# Patient Record
Sex: Female | Born: 1950 | Race: White | Hispanic: No | Marital: Married | Smoking: Former smoker
Health system: Southern US, Community
[De-identification: ages and names within clinical notes are randomized; demographics above are authoritative.]

## PROBLEM LIST (undated history)

## (undated) DIAGNOSIS — F329 Major depressive disorder, single episode, unspecified: Secondary | ICD-10-CM

## (undated) DIAGNOSIS — F419 Anxiety disorder, unspecified: Secondary | ICD-10-CM

## (undated) DIAGNOSIS — R112 Nausea with vomiting, unspecified: Secondary | ICD-10-CM

## (undated) DIAGNOSIS — M1990 Unspecified osteoarthritis, unspecified site: Secondary | ICD-10-CM

## (undated) DIAGNOSIS — R457 State of emotional shock and stress, unspecified: Secondary | ICD-10-CM

## (undated) DIAGNOSIS — G4730 Sleep apnea, unspecified: Secondary | ICD-10-CM

## (undated) DIAGNOSIS — E785 Hyperlipidemia, unspecified: Secondary | ICD-10-CM

## (undated) DIAGNOSIS — Z9889 Other specified postprocedural states: Secondary | ICD-10-CM

## (undated) DIAGNOSIS — E7800 Pure hypercholesterolemia, unspecified: Secondary | ICD-10-CM

## (undated) DIAGNOSIS — IMO0001 Reserved for inherently not codable concepts without codable children: Secondary | ICD-10-CM

## (undated) DIAGNOSIS — Z90710 Acquired absence of both cervix and uterus: Secondary | ICD-10-CM

## (undated) DIAGNOSIS — F32A Depression, unspecified: Secondary | ICD-10-CM

## (undated) DIAGNOSIS — Z9049 Acquired absence of other specified parts of digestive tract: Secondary | ICD-10-CM

## (undated) DIAGNOSIS — R7303 Prediabetes: Secondary | ICD-10-CM

## (undated) DIAGNOSIS — I10 Essential (primary) hypertension: Secondary | ICD-10-CM

## (undated) DIAGNOSIS — N289 Disorder of kidney and ureter, unspecified: Secondary | ICD-10-CM

## (undated) HISTORY — PX: CHOLECYSTECTOMY: SHX55

## (undated) HISTORY — PX: ABDOMINAL HYSTERECTOMY: SHX81

---

## 2001-02-03 ENCOUNTER — Other Ambulatory Visit: Admission: RE | Admit: 2001-02-03 | Discharge: 2001-02-03 | Payer: Self-pay | Admitting: Obstetrics and Gynecology

## 2001-02-09 ENCOUNTER — Ambulatory Visit (HOSPITAL_COMMUNITY): Admission: RE | Admit: 2001-02-09 | Discharge: 2001-02-09 | Payer: Self-pay | Admitting: Obstetrics and Gynecology

## 2001-02-09 ENCOUNTER — Encounter: Payer: Self-pay | Admitting: Obstetrics and Gynecology

## 2001-03-01 ENCOUNTER — Encounter (INDEPENDENT_AMBULATORY_CARE_PROVIDER_SITE_OTHER): Payer: Self-pay | Admitting: Specialist

## 2001-03-01 ENCOUNTER — Ambulatory Visit: Admission: RE | Admit: 2001-03-01 | Discharge: 2001-03-01 | Payer: Self-pay | Admitting: Gynecology

## 2001-03-04 ENCOUNTER — Encounter: Payer: Self-pay | Admitting: Gynecology

## 2001-03-09 ENCOUNTER — Inpatient Hospital Stay (HOSPITAL_COMMUNITY): Admission: RE | Admit: 2001-03-09 | Discharge: 2001-03-12 | Payer: Self-pay | Admitting: Obstetrics and Gynecology

## 2001-03-17 ENCOUNTER — Ambulatory Visit (HOSPITAL_COMMUNITY): Admission: RE | Admit: 2001-03-17 | Discharge: 2001-03-17 | Payer: Self-pay | Admitting: Obstetrics and Gynecology

## 2001-03-17 ENCOUNTER — Encounter: Payer: Self-pay | Admitting: Obstetrics and Gynecology

## 2001-04-26 ENCOUNTER — Ambulatory Visit (HOSPITAL_COMMUNITY): Admission: RE | Admit: 2001-04-26 | Discharge: 2001-04-26 | Payer: Self-pay | Admitting: Family Medicine

## 2001-04-26 ENCOUNTER — Encounter: Payer: Self-pay | Admitting: Family Medicine

## 2002-02-21 ENCOUNTER — Emergency Department (HOSPITAL_COMMUNITY): Admission: EM | Admit: 2002-02-21 | Discharge: 2002-02-22 | Payer: Self-pay | Admitting: Emergency Medicine

## 2002-02-22 ENCOUNTER — Encounter: Payer: Self-pay | Admitting: Emergency Medicine

## 2002-05-05 ENCOUNTER — Other Ambulatory Visit: Admission: RE | Admit: 2002-05-05 | Discharge: 2002-05-05 | Payer: Self-pay | Admitting: Obstetrics and Gynecology

## 2002-07-19 ENCOUNTER — Emergency Department (HOSPITAL_COMMUNITY): Admission: EM | Admit: 2002-07-19 | Discharge: 2002-07-19 | Payer: Self-pay | Admitting: Emergency Medicine

## 2002-08-26 ENCOUNTER — Emergency Department (HOSPITAL_COMMUNITY): Admission: EM | Admit: 2002-08-26 | Discharge: 2002-08-26 | Payer: Self-pay | Admitting: Emergency Medicine

## 2003-07-18 ENCOUNTER — Ambulatory Visit (HOSPITAL_COMMUNITY): Admission: RE | Admit: 2003-07-18 | Discharge: 2003-07-18 | Payer: Self-pay | Admitting: Family Medicine

## 2003-07-26 ENCOUNTER — Ambulatory Visit (HOSPITAL_COMMUNITY): Admission: RE | Admit: 2003-07-26 | Discharge: 2003-07-26 | Payer: Self-pay | Admitting: General Surgery

## 2004-06-30 ENCOUNTER — Other Ambulatory Visit: Admission: RE | Admit: 2004-06-30 | Discharge: 2004-06-30 | Payer: Self-pay | Admitting: Obstetrics and Gynecology

## 2006-08-13 ENCOUNTER — Ambulatory Visit (HOSPITAL_COMMUNITY): Admission: RE | Admit: 2006-08-13 | Discharge: 2006-08-13 | Payer: Self-pay | Admitting: Orthopaedic Surgery

## 2006-11-17 ENCOUNTER — Ambulatory Visit (HOSPITAL_COMMUNITY): Admission: RE | Admit: 2006-11-17 | Discharge: 2006-11-17 | Payer: Self-pay | Admitting: General Surgery

## 2006-11-17 ENCOUNTER — Encounter (INDEPENDENT_AMBULATORY_CARE_PROVIDER_SITE_OTHER): Payer: Self-pay | Admitting: General Surgery

## 2008-03-14 ENCOUNTER — Ambulatory Visit (HOSPITAL_COMMUNITY): Admission: RE | Admit: 2008-03-14 | Discharge: 2008-03-14 | Payer: Self-pay | Admitting: Family Medicine

## 2008-03-19 ENCOUNTER — Ambulatory Visit (HOSPITAL_COMMUNITY): Admission: RE | Admit: 2008-03-19 | Discharge: 2008-03-19 | Payer: Self-pay | Admitting: Family Medicine

## 2008-04-18 ENCOUNTER — Other Ambulatory Visit: Admission: RE | Admit: 2008-04-18 | Discharge: 2008-04-18 | Payer: Self-pay | Admitting: Interventional Radiology

## 2008-04-18 ENCOUNTER — Encounter (INDEPENDENT_AMBULATORY_CARE_PROVIDER_SITE_OTHER): Payer: Self-pay | Admitting: Interventional Radiology

## 2008-04-18 ENCOUNTER — Encounter: Admission: RE | Admit: 2008-04-18 | Discharge: 2008-04-18 | Payer: Self-pay | Admitting: Endocrinology

## 2008-10-18 ENCOUNTER — Ambulatory Visit (HOSPITAL_COMMUNITY): Admission: RE | Admit: 2008-10-18 | Discharge: 2008-10-18 | Payer: Self-pay | Admitting: Family Medicine

## 2008-12-07 ENCOUNTER — Emergency Department (HOSPITAL_COMMUNITY): Admission: EM | Admit: 2008-12-07 | Discharge: 2008-12-07 | Payer: Self-pay | Admitting: Emergency Medicine

## 2008-12-12 ENCOUNTER — Ambulatory Visit (HOSPITAL_COMMUNITY): Admission: RE | Admit: 2008-12-12 | Discharge: 2008-12-12 | Payer: Self-pay | Admitting: Family Medicine

## 2009-06-25 ENCOUNTER — Emergency Department (HOSPITAL_COMMUNITY): Admission: EM | Admit: 2009-06-25 | Discharge: 2009-06-26 | Payer: Self-pay | Admitting: Emergency Medicine

## 2010-01-02 ENCOUNTER — Ambulatory Visit (HOSPITAL_COMMUNITY): Admission: RE | Admit: 2010-01-02 | Discharge: 2010-01-02 | Payer: Self-pay | Admitting: Internal Medicine

## 2010-03-06 ENCOUNTER — Encounter: Admission: RE | Admit: 2010-03-06 | Discharge: 2010-03-06 | Payer: Self-pay | Admitting: Endocrinology

## 2010-07-23 LAB — URINALYSIS, ROUTINE W REFLEX MICROSCOPIC
Bilirubin Urine: NEGATIVE
Glucose, UA: NEGATIVE mg/dL
Ketones, ur: NEGATIVE mg/dL
Leukocytes, UA: NEGATIVE
Specific Gravity, Urine: >1.03 — ABNORMAL HIGH (ref 1.005–1.030)
pH: 5.5 (ref 5.0–8.0)

## 2010-07-23 LAB — BASIC METABOLIC PANEL
BUN: 23 mg/dL (ref 6–23)
Calcium: 9.8 mg/dL (ref 8.4–10.5)
Chloride: 102 mEq/L (ref 96–112)
Creatinine, Ser: 0.96 mg/dL (ref 0.4–1.2)
GFR calc non Af Amer: 60 mL/min — ABNORMAL LOW (ref >60)
Glucose, Bld: 128 mg/dL — ABNORMAL HIGH (ref 70–99)
Potassium: 4.2 mEq/L (ref 3.5–5.1)

## 2010-07-23 LAB — CBC
MCV: 86.7 fL (ref 78.0–100.0)
Platelets: 242 10*3/uL (ref 150–400)
RDW: 14 % (ref 11.5–15.5)

## 2010-07-23 LAB — DIFFERENTIAL
Basophils Absolute: 0 10*3/uL (ref 0.0–0.1)
Lymphocytes Relative: 6 % — ABNORMAL LOW (ref 12–46)
Monocytes Absolute: 0.5 10*3/uL (ref 0.1–1.0)
Neutro Abs: 13.7 10*3/uL — ABNORMAL HIGH (ref 1.7–7.7)

## 2010-07-23 LAB — URINE MICROSCOPIC-ADD ON

## 2010-09-16 NOTE — H&P (Signed)
NAMEMARLINDA, [name]                ACCOUNT NO.:  [id number]   MEDICAL RECORD NO.:  [id number]          PATIENT TYPE:  AMB   LOCATION:  DAY                           FACILITY:  [facility]   PHYSICIAN:  Dalia Heading, M.D.  DATE OF BIRTH:  March 21, 1951   DATE OF ADMISSION:  11/17/2006  DATE OF DISCHARGE:  LH                              HISTORY & PHYSICAL   CHIEF COMPLAINT:  Hematochezia.   HISTORY OF PRESENT ILLNESS:  The patient is a 60 year old white female  who was referred for endoscopic evaluation.  She needs a colonoscopy for  intermittent hematochezia.  No abdominal pain, weight loss, nausea,  vomiting, diarrhea, constipation, or melena have been noted.  She has  never had a colonoscopy.  There is no family history of colon carcinoma.  There is a questionable history of hemorrhoidal disease with difficulty  keeping herself clean after wiping.   PAST MEDICAL HISTORY:  1. Hypertension.  2. Depression.   PAST SURGICAL HISTORY:  1. Cholecystectomy.  2. Hysterectomy.  3. Wisdom teeth pulled.   CURRENT MEDICATIONS:  Diovan, Lexapro, labetalol, Lipitor, calcium  supplements, fish oil supplements.   ALLERGIES:  Norvasc.   REVIEW OF SYSTEMS:  Noncontributory.   PHYSICAL EXAMINATION:  GENERAL:  The patient is a well-developed, well-  nourished white female in no acute distress.  LUNGS:  Clear to auscultation with equal breath sounds bilaterally.  HEART:  Examination reveals a regular rate and rhythm without S3, S4, or  murmurs.  ABDOMEN:  Soft, nontender and non-distended.  No hepatosplenomegaly or  masses are noted.  RECTAL:  Deferred to the procedure.   IMPRESSION:  Hematochezia.   PLAN:  The patient is scheduled for a colonoscopy on November 17, 2006.  The  risks and benefits of the procedure including bleeding and perforation  were fully explained to the patient, who gave informed consent.      Dalia Heading, M.D.  Electronically Signed     MAJ/MEDQ  D:  11/11/2006   T:  11/12/2006  Job:  [id number]   cc:   Patrica Duel, M.D.  Fax: [phone]

## 2010-09-19 NOTE — Op Note (Signed)
[name], [name]                       ACCOUNT NO.:  [id number]   MEDICAL RECORD NO.:  [id number]                   PATIENT TYPE:  AMB   LOCATION:  DAY                                  FACILITY:  [facility]   PHYSICIAN:  Dalia Heading, M.D.               DATE OF BIRTH:  [birth year]   DATE OF PROCEDURE:  DATE OF DISCHARGE:                                 OPERATIVE REPORT   PREOPERATIVE DIAGNOSIS:  Cholecystitis, cholelithiasis.   POSTOPERATIVE DIAGNOSIS:  Cholecystitis, cholelithiasis.   PROCEDURE:  Laparoscopic cholecystectomy   SURGEON:  Dalia Heading, M.D.   ASSISTANT:  Buena Irish, M.D.   ANESTHESIA:  General endotracheal.   INDICATIONS:  The patient is a 60 year old white female who presents with  cholecystitis secondary to cholelithiasis.  The risks and benefits of the  procedure including bleeding, infection, hepatobiliary injury, and the  possibility of an open procedure were fully explained to the patient, who  gave informed consent.   DESCRIPTION OF PROCEDURE:  The patient was placed in the supine position.  After induction of general endotracheal anesthesia, the abdomen was prepped  and draped using the usual sterile technique with Betadine.  Surgical site  confirmation was performed.   A supraumbilical incision was made down to the fascia.  A Veress needle was  introduced into the abdominal cavity and confirmation of placement was done  using the saline drop test.  The abdomen was then insufflated to 16 mmHg  pressure.  An 11-mm trocar was introduced into the abdominal cavity under  direct visualization without difficulty.  The patient was placed then in  reverse Trendelenburg position and an additional 11-mm trocar was placed in  the epigastric region and 5-mm trocars were placed in the right upper  quadrant and right flank regions.   The liver was inspected and noted to be within normal limits.  The  gallbladder was retracted superiorly and  laterally.  The dissection was  begun around the infundibulum of the gallbladder.  The cystic duct was first  identified.  Its juncture to the infundibulum fully identified.  Endoclips  were placed proximally and distally on the cystic duct; and the cystic duct  was divided.  This was likewise done on the cystic artery.  The gallbladder  was then freed away from the gallbladder fossa using Bovie electrocautery.  The gallbladder was delivered through the epigastric trocar site using an  EndoCatch bag.  The gallbladder fossa was inspected and no abnormal bleeding  or bile leakage was noted.  Surgicel was placed in the gallbladder fossa.  All fluid and air were then evacuated from the abdominal cavity prior to  removal of the trocars.   All wounds were irrigated with normal saline.  All wounds were injected with  0.5% Sensorcaine.  The supraumbilical fascia was reapproximated using an #0  Vicryl interrupted suture. All skin incisions were closed using staples.  Betadine ointment and  dry sterile dressings were applied.   All tape and needle counts correct at the end of the procedure.  The patient  was extubated in the operating room and went back to recovery room in awake  and stable condition.   COMPLICATIONS:  None.   SPECIMENS:  Gallbladder with stones.   BLOOD LOSS:  Minimal.      ___________________________________________                                            Dalia Heading, M.D.   MAJ/MEDQ  D:  07/26/2003  T:  07/26/2003  Job:  [id number]   cc:   Dalia Heading, M.D.  [address] [place]  [place] [address]  Fax: [phone]   Patrica Duel, M.D.  [address]  [place]  [place] [address]  Fax: [phone]

## 2010-09-19 NOTE — Consult Note (Signed)
[facility]  Patient:    [name], [name] Visit Number: [id number] MRN: [id number]          Service Type: GON Location: GYN Attending Physician:  Jeannette Corpus Dictated by:   Rande Brunt. Clarke-Pearson, M.D. Proc. Date: 03/01/01 Admit Date:  03/01/2001   CC:         Guy Sandifer. Arleta Creek, M.D.  Telford Nab, R.N.   Consultation Report  The patient is a 60 year old white female referred by Dr. Harold Hedge for preoperative evaluation.  The patient in August 2002, saw her primary gynecologist in [place] who discovered an ovarian cyst.  She subsequently sought second opinion from Dr. Henderson Cloud who re-evaluated the patient and found stable ovarian cyst.  On ultrasound the cyst is 6.5 x 6.1 x 5.1 cm, and there are several nodular densities on the edge of the cyst, there is no increase in color flow.  The pelvic MRI has been done in September, which showed bilateral adnexal cysts with a mass measuring 6.2 cm.  The patient has a normal CEA, and a CA125 of 14 units per mL.  The patient herself denies any abdominal pain or pressure.  Has no real GI or GU symptoms.  PAST MEDICAL HISTORY: 1. Hypertension. 2. Obesity. 3. Anxiety.  CURRENT MEDICATIONS: 1. Prinivil 10 mg q.d. 2. Maxzide 25 mg q.d. 3. Effexor 50 mg q.h.s.  PAST SURGICAL HISTORY: 1. Cesarean section x 2. 2. Dilatation and curettage. 3. Abdominal hysterectomy in 1984, because of heavy bleeding.  SOCIAL HISTORY:  The patient does not smoke or drink.  OBSTETRICAL HISTORY:  Gravida 3, para 2.  ALLERGIES:  No known drug allergies.  PHYSICAL EXAMINATION:  VITAL SIGNS:  Weight 240 pounds, height 5 feet 7 inches, blood pressure 155/94, pulse 92, respiratory rate 18.  GENERAL:  The patient is a moderately obese white female in no acute distress.  HEENT:  Negative.  NECK:  Supple without thyromegaly.  There is no supraclavicular or inguinal adenopathy.  ABDOMEN:  Obese, soft,  nontender, no mass, organomegaly, ascites, or hernias are noted.  PELVIC:  EGBUS normal.  Vagina is clean, well supported, no lesions noted. Bimanual and rectovaginal examination reveal some fullness at the vaginal cuff without a discrete mass.  Rectovaginal examination confirms.  IMPRESSION:  Ovarian cyst with some nodularity, normal CA125.  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  I will be happy to serve as a co-surgeon with Dr. Henderson Cloud at the time of surgery if he desires.  Risks of surgery were outlined to the patient.  She is aware of the risks of infection, hemorrhage, injury to adjacent viscera, and thromboembolic complications.  Further, on the outside chance that this is an ovarian cancer, I explained to her the extent of surgical staging and potential future therapy.  We will coordinate with Dr. Marvetta Gibbons office. Dictated by:   Rande Brunt. Clarke-Pearson, M.D. Attending Physician:  Jeannette Corpus DD:  03/02/01 TD:  03/02/01 Job: [id number]

## 2010-09-19 NOTE — H&P (Signed)
[name], [name]                            ACCOUNT NO.:  [id number]   MEDICAL RECORD NO.:  [id number]                  PATIENT TYPE:   LOCATION:                                       FACILITY:   PHYSICIAN:  Dalia Heading, M.D.               DATE OF BIRTH:  [birth year]   DATE OF ADMISSION:  DATE OF DISCHARGE:                                HISTORY & PHYSICAL   CHIEF COMPLAINT:  1. Biliary colic.  2. Cholelithiasis.   HISTORY OF PRESENT ILLNESS:  The patient is a 60 year old white female who  was referred for evaluation and treatment of biliary colic secondary to  cholelithiasis.  She has been having intermittent episodes of right upper  quadrant abdominal pain with radiation to the right flank, nausea and  bloating for over a year.  Her symptoms seemed to be worsening.  No fever,  chills or jaundice have been noted.   PAST MEDICAL HISTORY:  Includes depression, hypertension.   PAST SURGICAL HISTORY:  1. Hysterectomy.  2. C-section x2.   CURRENT MEDICATIONS:  1. Diovan 80 mg p.o. q.a.m.  2. Lexapro 20 mg p.o. q.a.m.  3. Labetalol 200 mg p.o. q.h.s.  4. Sodium bicarbonate 650 mg p.o. q.h.s.  5. Vytorin 10/20 one tablet q.h.s.  6. Vitamin supplements.  7. Ambien and Xanax p.r.n.   ALLERGIES:  An ACE inhibitor, Norvasc, nonsteroidal antiinflammatory drugs.   REVIEW OF SYSTEMS:  The patient has had a history of renal insufficiency,  although this has resolved.  This occurred after having a hysterectomy in  2002.  She denies any recent cardiopulmonary difficulties or bleeding  disorders.  She denies drinking or smoking.   PHYSICAL EXAMINATION:  GENERAL:  The patient is a well-developed, well-  nourished white female, in no acute distress.  VITAL SIGNS:  She is afebrile.  Vital signs are stable.  HEENT:  Examination reveals no scleral icterus.  LUNGS:  Clear to auscultation with equal breath sounds bilaterally.  HEART:  Examination reveals a regular rate and rhythm  without S3, S4 or  murmurs.  ABDOMEN:  Soft with slight tenderness noted in the right upper quadrant to  palpation.  No hepatosplenomegaly, masses or hernias are identified.  Ultrasound of the gallbladder in the past revealed cholelithiasis.  Hepatobiliary scan revealed no filling of the gallbladder.   IMPRESSION:  1. Cholecystitis.  2. Cholelithiasis.   PLAN:  The patient is scheduled for laparoscopic cholecystectomy on July 26, 2003.  The risks and benefits of the procedure including bleeding,  infection, hepatobiliary injury and the possibility of an open procedure  were fully explained to the patient, who gave informed consent.     ___________________________________________  Dalia Heading, M.D.   MAJ/MEDQ  D:  07/24/2003  T:  07/24/2003  Job:  [id number]   cc:   Patrica Duel, M.D.  [address]  [place]  [place] [address]  Fax: [phone]

## 2010-09-19 NOTE — Discharge Summary (Signed)
[facility]  Patient:    [name], [name] Visit Number: [id number] MRN: [id number]          Service Type: GYN Location: 4W 0455 01 Attending Physician:  Soledad Gerlach Dictated by:   Guy Sandifer Arleta Creek, M.D. Admit Date:  03/09/2001 Discharge Date: 03/12/2001                             Discharge Summary  ADMITTING DIAGNOSIS:  Bilateral ovarian cysts.  DISCHARGE DIAGNOSIS:  Bilateral ovarian cysts.  PROCEDURE:  Exploratory laparotomy with bilateral salpingo-oophorectomy and lysis of adhesions on 03/09/01.  REASON FOR ADMISSION:  This patient is a 60 year old married white female, G3, P2, AB1, status post hysterectomy, noted to have bilateral ovarian cysts. Details are dictated in the history and physical.  HOSPITAL COURSE:  On March 09, 2001, patient is taken to the operating room and undergoes the above procedure.  Estimated blood loss is less than 100 cc. On the evening of surgery, she has good pain control.  Blood pressures are in the 80s/50s when the patient is sedated with her PCA morphine.  It ranged from the 80s to the 110s/50s to 70s.  Pulse was 80 and regular and she was afebrile.  Urine output was somewhat concentrated and the remainder of her exam was within normal limits.  She was treated with an IV fluid bolus at that time.  She received a second fluid bolus at approximately 3 a.m. for concentrated urine and decreased urine output.  On the morning of November 7, she continued to have good pain relief and was without flatus.  Blood pressures were 110s/60s and she was afebrile.  Urine output at that point was dilute.  White count was 11.0, hemoglobin 11.3.  Her Prinivil was held that morning.  IV fluids were increased for a bolus of 500 cc.  On the evening of November 7, patient was somewhat anxious as she had been preoperatively. She had been taking Effexor as well as Xanax preoperatively.  She also complained of gas  pain and was not yet passing flatus.  She was having nausea and vomiting only with some liquids, although was doing somewhat better that evening.  Blood pressures were 110s to 120s/70s.  She had approximately 100 cc of dilute urine in the Foley bag at that time.  Repeat blood work that day revealed a stable CBC.  Her potassium was noted to be 2.9.  IV potassium had been ordered for the patient although she complained of pain with this. Therefore, they were held at that time.  She was given a Dulcolax suppository, IV Ativan, and IV fluids.  Ambulation was encouraged.  On the morning of November 8, she was hungry, having a bowel movement.  She remained afebrile and blood pressures were 100s to 140s/50s to 70s.  Urine output was improving and was dilute at that time.  Her diet was advanced.  She was given oral potassium.  On the morning of November 9, she is feeling much better, tolerating a regular diet, and remains afebrile.  Blood pressures are 150s/70s.   Repeat blood work reveals potassium to be 3.6 and creatinine elevated to 3.5.  It should be noted that her Prinivil had been elevated from 10 to 20 mg several days prior to surgery.  Patient was discharged home in good condition.   Diet:  Regular as tolerated.  Activity:  No lifting or operation of automobiles.  No vaginal entry.  She is to call the office for problems including but not limited to increasing pain, persistent nausea and vomiting, temperature of 100 degrees.  Follow-up is in the office in two days.  MEDICATIONS: 1. Percocet 5/325 #30 one to two p.o. q.6h. p.r.n. 2. Phenergan 25 mg tablets #10 one p.o. q.8h. p.r.n. with no refills.  She will resume hydrochlorothiazide, Effexor, and Xanax p.r.n.  Will hold the Effexor until reevaluation of creatinine status and blood pressure in two days. Dictated by:   Guy Sandifer Arleta Creek, M.D. Attending Physician:  Soledad Gerlach DD:  03/12/01 TD:  03/13/01 Job:  [id number]

## 2010-09-19 NOTE — Op Note (Signed)
[facility]  Patient:    [name], [name] Visit Number: [id number] MRN: [id number]          Service Type: GYN Location: 4W 0455 01 Attending Physician:  Soledad Gerlach Dictated by:   Rande Brunt Clarke-Pearson, M.D. Proc. Date: 03/09/01 Admit Date:  03/09/2001                             Operative Report  PREOPERATIVE DIAGNOSIS:  Bilateral complex pelvic masses.  POSTOPERATIVE DIAGNOSIS:  Bilateral serous cystadenofibroma of the ovaries.  OPERATION:  Exploratory laparotomy, bilateral salpingo-oophorectomy, lysis of intestinal adhesions.  COSURGEONS: 1. Daniel L. Clarke-Pearson, M.D. 2. Guy Sandifer. Arleta Creek, M.D.  ASSISTANT:  Telford Nab, R.N.  ANESTHESIA:  General with orotracheal tubes.  ESTIMATED BLOOD LOSS:  Less than 100 cc.  SURGICAL FINDINGS:  At exploratory laparotomy the sigmoid colon was densely adherent to a 6-7 cm complex left ovarian mass.  Adhesions were also present on the right side of the pelvis which contained a slightly smaller 5 cm cystic mass of the right ovary.  On frozen section, both ovaries were found to be serous cystadenofibromas.  Exploration of the upper abdomen including diaphragms, liver, omentum, and stomach were normal without any pathology recognized.  DESCRIPTION OF PROCEDURE:  The patient was brought to the operating room and after the satisfactory attainment of general anesthesia was placed in the modified lithotomy position in the [place] stirrups.  The anterior abdominal wall, perineum and vagina were prepped with Betadine.  Foley catheter was placed, and the patient was draped.  The abdomen was entered through a low midline incision and peritoneal washings were obtained.  The upper abdomen and pelvis were explored with the above noted findings.  Omental adhesions to the pelvis were freed with sharp and blunt dissection.  Buchwalter retractor was positioned, and the bowel was packed out of  the pelvis.  Attention was turned to the larger mass on the left side of the pelvis.  The retroperitoneal space on the left side of the pelvis was opened, identifying the psoas muscle, external iliac artery, and ureter.  Adhesions of the ovarian tumor to the sigmoid colon mesentery were lysed with sharp and blunt dissection using Bovie cautery.  The ovarian vessels were skeletonized, clamped, cut, free tied and suture ligated using 2-0 Vicryl.  I further mobilized the ovarian tumor away from the mesentery and the pelvic peritoneum until it was completely freed and submitted for frozen section.  Hemostasis was achieved with cautery.  Attention was turned to the right side of the pelvis where a similar procedure was performed entering the retroperitoneal space, identifying the ureter and ligating the ovarian vessels.  Using sharp and blunt dissection, the right tube and ovary were removed and submitted to frozen section.  The pelvis was irrigated with copious amounts of warm saline and hemostasis assured.  The retractors and packs were removed.  The anterior abdominal wall was closed in layers, the first being a running mass closure using #1 PDS.  The subcutaneous tissue was irrigated.  Hemostasis was achieved with cautery and the subcutaneous tissue reapproximated with interrupted 3-0 Vicryl sutures.  The skin was closed with skin staples.  A dressing was applied. The patient was awakened from anesthesia and taken to the recovery room in satisfactory condition.  Sponge, needle and instrument counts were correct x 2. Dictated by:   Rande Brunt. Clarke-Pearson, M.D. Attending Physician:  Cordelia Pen  Ii DD:  03/09/01 TD:  03/11/01 Job: [id number]

## 2010-10-24 ENCOUNTER — Emergency Department (HOSPITAL_COMMUNITY)
Admission: EM | Admit: 2010-10-24 | Discharge: 2010-10-24 | Disposition: A | Discharge status: Home / Self Care | Payer: 59 | Attending: Emergency Medicine | Admitting: Emergency Medicine

## 2010-10-24 ENCOUNTER — Emergency Department (HOSPITAL_COMMUNITY): Payer: 59

## 2010-10-24 DIAGNOSIS — I10 Essential (primary) hypertension: Secondary | ICD-10-CM | POA: Insufficient documentation

## 2010-10-24 DIAGNOSIS — Z79899 Other long term (current) drug therapy: Secondary | ICD-10-CM | POA: Insufficient documentation

## 2010-10-24 DIAGNOSIS — F411 Generalized anxiety disorder: Secondary | ICD-10-CM | POA: Insufficient documentation

## 2010-10-24 DIAGNOSIS — R42 Dizziness and giddiness: Secondary | ICD-10-CM | POA: Insufficient documentation

## 2010-10-24 DIAGNOSIS — E7800 Pure hypercholesterolemia, unspecified: Secondary | ICD-10-CM | POA: Insufficient documentation

## 2010-10-24 LAB — CBC
HCT: 38.5 % (ref 36.0–46.0)
Hemoglobin: 13.1 g/dL (ref 12.0–15.0)
MCH: 29.6 pg (ref 26.0–34.0)
MCHC: 34 g/dL (ref 30.0–36.0)
MCV: 87.1 fL (ref 78.0–100.0)
Platelets: 223 K/uL (ref 150–400)
RBC: 4.42 MIL/uL (ref 3.87–5.11)
RDW: 13 % (ref 11.5–15.5)
WBC: 10.9 K/uL — ABNORMAL HIGH (ref 4.0–10.5)

## 2010-10-24 LAB — BASIC METABOLIC PANEL
BUN: 29 mg/dL — ABNORMAL HIGH (ref 6–23)
CO2: 24 mEq/L (ref 19–32)
Creatinine, Ser: 1.48 mg/dL — ABNORMAL HIGH (ref 0.50–1.10)
GFR calc Af Amer: 44 mL/min — ABNORMAL LOW (ref >60)
Glucose, Bld: 106 mg/dL — ABNORMAL HIGH (ref 70–99)
Sodium: 134 mEq/L — ABNORMAL LOW (ref 135–145)

## 2010-10-24 LAB — DIFFERENTIAL
Basophils Absolute: 0.1 10*3/uL (ref 0.0–0.1)
Basophils Relative: 1 % (ref 0–1)
Eosinophils Absolute: 0.1 10*3/uL (ref 0.0–0.7)

## 2010-11-20 ENCOUNTER — Emergency Department (HOSPITAL_COMMUNITY)
Admission: EM | Admit: 2010-11-20 | Discharge: 2010-11-20 | Disposition: A | Discharge status: Home / Self Care | Payer: 59 | Attending: Emergency Medicine | Admitting: Emergency Medicine

## 2010-11-20 ENCOUNTER — Other Ambulatory Visit: Payer: Self-pay

## 2010-11-20 ENCOUNTER — Encounter: Payer: Self-pay | Admitting: Emergency Medicine

## 2010-11-20 DIAGNOSIS — Z87891 Personal history of nicotine dependence: Secondary | ICD-10-CM | POA: Insufficient documentation

## 2010-11-20 DIAGNOSIS — R5381 Other malaise: Secondary | ICD-10-CM | POA: Insufficient documentation

## 2010-11-20 DIAGNOSIS — R5383 Other fatigue: Secondary | ICD-10-CM | POA: Insufficient documentation

## 2010-11-20 DIAGNOSIS — I10 Essential (primary) hypertension: Secondary | ICD-10-CM | POA: Insufficient documentation

## 2010-11-20 DIAGNOSIS — E785 Hyperlipidemia, unspecified: Secondary | ICD-10-CM | POA: Insufficient documentation

## 2010-11-20 HISTORY — DX: Essential (primary) hypertension: I10

## 2010-11-20 HISTORY — DX: Hyperlipidemia, unspecified: E78.5

## 2010-11-20 LAB — BASIC METABOLIC PANEL
CO2: 31 mEq/L (ref 19–32)
Glucose, Bld: 95 mg/dL (ref 70–99)
Potassium: 4.8 mEq/L (ref 3.5–5.1)
Sodium: 141 mEq/L (ref 135–145)

## 2010-11-20 LAB — DIFFERENTIAL
Eosinophils Relative: 2 % (ref 0–5)
Lymphocytes Relative: 36 % (ref 12–46)
Lymphs Abs: 2.8 10*3/uL (ref 0.7–4.0)
Monocytes Relative: 4 % (ref 3–12)
Neutrophils Relative %: 57 % (ref 43–77)

## 2010-11-20 LAB — AMMONIA: Ammonia: 26 umol/L (ref 11–60)

## 2010-11-20 LAB — URINALYSIS, ROUTINE W REFLEX MICROSCOPIC
Glucose, UA: NEGATIVE mg/dL
Hgb urine dipstick: NEGATIVE
Specific Gravity, Urine: 1.01 (ref 1.005–1.030)

## 2010-11-20 LAB — CBC
Hemoglobin: 12.9 g/dL (ref 12.0–15.0)
MCV: 88 fL (ref 78.0–100.0)
Platelets: 199 10*3/uL (ref 150–400)
RBC: 4.43 MIL/uL (ref 3.87–5.11)
WBC: 7.9 10*3/uL (ref 4.0–10.5)

## 2010-11-20 MED ORDER — SODIUM CHLORIDE 0.9 % IV BOLUS (SEPSIS)
500.0000 mL | Freq: Once | INTRAVENOUS | Status: AC
  Administered 2010-11-20: 1000 mL via INTRAVENOUS

## 2010-11-20 NOTE — ED Provider Notes (Signed)
History     Chief Complaint  Patient presents with  . Fatigue   HPI Comments: Patient c/o generalized weakness for approximately one week.  States she had similar sx's previously.  She ahs also been evaluated by her PMD for same and seen in ED previously for same.  She denies vomiting, fever, headaches, neck pain or stiffness or  changes in her speech.  Patient is a 60 y.o. female presenting with neurologic complaint. The history is provided by the patient.  Neurologic Problem Primary symptoms do not include headaches, syncope, loss of consciousness, altered mental status, seizures, dizziness, visual change, speech change, fever, nausea or vomiting. Primary symptoms comment: generalized weakness and fatique The symptoms began 5 to 7 days ago. The symptoms are improving. The neurological symptoms are diffuse.  Additional symptoms include weakness. Additional symptoms do not include neck stiffness, pain, lower back pain, leg pain, loss of balance, photophobia, hallucinations, hearing loss, tinnitus, vertigo or anxiety. Medical issues also include hypertension. Medical issues do not include seizures, cerebral vascular accident or diabetes. Workup history includes CT scan.    Past Medical History  Diagnosis Date  . Hypertension   . Hyperlipidemia     Past Surgical History  Procedure Date  . Cesarean section   . Abdominal hysterectomy     No family history on file.  History  Substance Use Topics  . Smoking status: Former Games developer  . Smokeless tobacco: Not on file  . Alcohol Use: No    OB History    Grav Para Term Preterm Abortions TAB SAB Ect Mult Living                  Review of Systems  Constitutional: Positive for fatigue. Negative for fever, chills, appetite change and unexpected weight change.  HENT: Negative for hearing loss, trouble swallowing, neck pain, neck stiffness and tinnitus.   Eyes: Negative for photophobia and visual disturbance.  Respiratory: Negative for  cough, chest tightness and wheezing.   Cardiovascular: Negative.  Negative for syncope.  Gastrointestinal: Negative for nausea, vomiting, abdominal pain, diarrhea and blood in stool.  Genitourinary: Negative for dysuria and flank pain.  Musculoskeletal: Negative.   Skin: Negative.   Neurological: Positive for weakness. Negative for dizziness, vertigo, speech change, seizures, loss of consciousness, syncope, speech difficulty, numbness, headaches and loss of balance.  Hematological: Does not bruise/bleed easily.  Psychiatric/Behavioral: Negative for hallucinations and altered mental status.    Physical Exam  BP 112/65  Pulse 69  Temp(Src) 98.1 F (36.7 C) (Oral)  Resp 20  Ht 5\' 6"  (1.676 m)  Wt 185 lb (83.915 kg)  BMI 29.86 kg/m2  SpO2 100%  Physical Exam  Nursing note and vitals reviewed. Constitutional: She is oriented to person, place, and time. She appears well-developed and well-nourished. No distress.  HENT:  Head: Normocephalic and atraumatic.  Right Ear: External ear normal.  Left Ear: External ear normal.  Eyes: Conjunctivae and EOM are normal. Pupils are equal, round, and reactive to light.  Neck: Normal range of motion. Neck supple. No JVD present. No thyromegaly present.  Cardiovascular: Normal rate, regular rhythm and normal heart sounds.   Pulmonary/Chest: Effort normal and breath sounds normal.  Abdominal: Soft. Bowel sounds are normal. There is no tenderness.  Musculoskeletal: She exhibits no edema and no tenderness.  Lymphadenopathy:    She has no cervical adenopathy.  Neurological: She is alert and oriented to person, place, and time. She displays normal reflexes. No cranial nerve deficit. She exhibits normal  muscle tone. Coordination normal.  Skin: Skin is warm and dry.  Psychiatric: She has a normal mood and affect.    ED Course  Procedures  MDM  2220 Patient is resting, NAD. Vitals stable. Ambulates w/o difficulty.  No focal neuro deficits or focal  weakness.  Hx of similar sx's previously.  Pt was seen by EDP also.  She agrees to f/u with her PMD.  I have also reviewed pt's previous negative CT head from 10/24/10   Date: 11/20/2010  Rate: 58  Rhythm: sinus bradycardia  QRS Axis: normal  Intervals: normal  ST/T Wave abnormalities: normal  Conduction Disutrbances:none  Narrative Interpretation:   Old EKG Reviewed: unchanged  Results for orders placed during the hospital encounter of 11/20/10  CBC      Component Value Range   WBC 7.9  4.0 - 10.5 (K/uL)   RBC 4.43  3.87 - 5.11 (MIL/uL)   Hemoglobin 12.9  12.0 - 15.0 (g/dL)   HCT 16.1  09.6 - 04.5 (%)   MCV 88.0  78.0 - 100.0 (fL)   MCH 29.1  26.0 - 34.0 (pg)   MCHC 33.1  30.0 - 36.0 (g/dL)   RDW 40.9  81.1 - 91.4 (%)   Platelets 199  150 - 400 (K/uL)  DIFFERENTIAL      Component Value Range   Neutrophils Relative 57  43 - 77 (%)   Neutro Abs 4.5  1.7 - 7.7 (K/uL)   Lymphocytes Relative 36  12 - 46 (%)   Lymphs Abs 2.8  0.7 - 4.0 (K/uL)   Monocytes Relative 4  3 - 12 (%)   Monocytes Absolute 0.4  0.1 - 1.0 (K/uL)   Eosinophils Relative 2  0 - 5 (%)   Eosinophils Absolute 0.2  0.0 - 0.7 (K/uL)   Basophils Relative 1  0 - 1 (%)   Basophils Absolute 0.1  0.0 - 0.1 (K/uL)  BASIC METABOLIC PANEL      Component Value Range   Sodium 141  135 - 145 (mEq/L)   Potassium 4.8  3.5 - 5.1 (mEq/L)   Chloride 103  96 - 112 (mEq/L)   CO2 31  19 - 32 (mEq/L)   Glucose, Bld 95  70 - 99 (mg/dL)   BUN 20  6 - 23 (mg/dL)   Creatinine, Ser 7.82  0.50 - 1.10 (mg/dL)   Calcium 95.6  8.4 - 10.5 (mg/dL)   GFR calc non Af Amer >60  >60 (mL/min)   GFR calc Af Amer >60  >60 (mL/min)  URINALYSIS, ROUTINE W REFLEX MICROSCOPIC      Component Value Range   Color, Urine YELLOW  YELLOW    Appearance CLEAR  CLEAR    Specific Gravity, Urine 1.010  1.005 - 1.030    pH 5.5  5.0 - 8.0    Glucose, UA NEGATIVE  NEGATIVE (mg/dL)   Hgb urine dipstick NEGATIVE  NEGATIVE    Bilirubin Urine NEGATIVE   NEGATIVE    Ketones, ur NEGATIVE  NEGATIVE (mg/dL)   Protein, ur NEGATIVE  NEGATIVE (mg/dL)   Urobilinogen, UA 0.2  0.0 - 1.0 (mg/dL)   Nitrite NEGATIVE  NEGATIVE    Leukocytes, UA NEGATIVE  NEGATIVE   AMMONIA - [place] ONLY      Component Value Range   Ammonia 26  11 - 60 (umol/L)           Jataya Wann L. [place], [place] 11/29/10 2356

## 2010-11-20 NOTE — ED Notes (Signed)
MD at bedside. 

## 2010-11-20 NOTE — ED Notes (Signed)
Pt ambulated to restroom without difficulty also obtained urine specimed at this time. [name]

## 2010-11-20 NOTE — ED Notes (Signed)
Pt given warm blanket, family and pt updated on care,

## 2010-11-20 NOTE — ED Notes (Signed)
Pt sleeping,

## 2010-11-20 NOTE — ED Notes (Signed)
Pt c/o weakness x one week.

## 2010-11-20 NOTE — ED Notes (Signed)
Family at bedside. 

## 2010-11-20 NOTE — ED Notes (Signed)
C/o head throbbing in her head, ears, having spells of periods of confusion, pt states that when she takes her xanax she feels normal again, weakness since last Tuesday a week ago, increased in sleeping,  had ct scan done about 3 weeks ago,  Problems with balance, denies any cold symptoms, uti symptoms, has had blood work recently, denies any chills, fever,

## 2010-11-26 NOTE — ED Provider Notes (Deleted)
History     Chief Complaint  Patient presents with  . Fatigue   HPI  Past Medical History  Diagnosis Date  . Hypertension   . Hyperlipidemia     Past Surgical History  Procedure Date  . Cesarean section   . Abdominal hysterectomy     No family history on file.  History  Substance Use Topics  . Smoking status: Former Games developer  . Smokeless tobacco: Not on file  . Alcohol Use: No    OB History    Grav Para Term Preterm Abortions TAB SAB Ect Mult Living                  Review of Systems  Physical Exam  BP 124/74  Pulse 60  Temp(Src) 98.1 F (36.7 C) (Oral)  Resp 20  Ht 5\' 6"  (1.676 m)  Wt 185 lb (83.915 kg)  BMI 29.86 kg/m2  SpO2 97%  Physical Exam  ED Course  Procedures  MDM Medical screening examination/treatment/procedure(s) were conducted as a shared visit with non-physician practitioner(s) and myself.  I personally evaluated the patient during the encounter       Nelia Shi, MD 11/26/10 2144

## 2010-11-30 NOTE — ED Provider Notes (Signed)
Medical screening examination/treatment/procedure(s) were performed by non-physician practitioner and as supervising physician I was immediately available for consultation/collaboration.   Nelia Shi, MD 11/30/10 [phone]

## 2010-12-01 ENCOUNTER — Other Ambulatory Visit (HOSPITAL_COMMUNITY): Payer: Self-pay | Admitting: Internal Medicine

## 2010-12-01 DIAGNOSIS — E785 Hyperlipidemia, unspecified: Secondary | ICD-10-CM

## 2010-12-01 DIAGNOSIS — I10 Essential (primary) hypertension: Secondary | ICD-10-CM

## 2010-12-03 ENCOUNTER — Ambulatory Visit (HOSPITAL_COMMUNITY)
Admission: RE | Admit: 2010-12-03 | Discharge: 2010-12-03 | Disposition: A | Discharge status: Home / Self Care | Payer: 59 | Source: Ambulatory Visit | Attending: Internal Medicine | Admitting: Internal Medicine

## 2010-12-03 DIAGNOSIS — G459 Transient cerebral ischemic attack, unspecified: Secondary | ICD-10-CM | POA: Insufficient documentation

## 2010-12-03 DIAGNOSIS — E785 Hyperlipidemia, unspecified: Secondary | ICD-10-CM

## 2010-12-03 DIAGNOSIS — I10 Essential (primary) hypertension: Secondary | ICD-10-CM | POA: Insufficient documentation

## 2010-12-10 ENCOUNTER — Emergency Department (HOSPITAL_COMMUNITY)
Admission: EM | Admit: 2010-12-10 | Discharge: 2010-12-10 | Disposition: A | Discharge status: Home / Self Care | Payer: 59 | Attending: Emergency Medicine | Admitting: Emergency Medicine

## 2010-12-10 DIAGNOSIS — F411 Generalized anxiety disorder: Secondary | ICD-10-CM | POA: Insufficient documentation

## 2010-12-10 DIAGNOSIS — I10 Essential (primary) hypertension: Secondary | ICD-10-CM | POA: Insufficient documentation

## 2010-12-10 DIAGNOSIS — F329 Major depressive disorder, single episode, unspecified: Secondary | ICD-10-CM | POA: Insufficient documentation

## 2010-12-10 DIAGNOSIS — E7800 Pure hypercholesterolemia, unspecified: Secondary | ICD-10-CM | POA: Insufficient documentation

## 2010-12-10 DIAGNOSIS — R51 Headache: Secondary | ICD-10-CM | POA: Insufficient documentation

## 2010-12-10 DIAGNOSIS — F3289 Other specified depressive episodes: Secondary | ICD-10-CM | POA: Insufficient documentation

## 2010-12-10 DIAGNOSIS — Z79899 Other long term (current) drug therapy: Secondary | ICD-10-CM | POA: Insufficient documentation

## 2010-12-22 ENCOUNTER — Emergency Department (HOSPITAL_COMMUNITY)
Admission: EM | Admit: 2010-12-22 | Discharge: 2010-12-22 | Disposition: A | Discharge status: Home / Self Care | Payer: 59 | Attending: Emergency Medicine | Admitting: Emergency Medicine

## 2010-12-22 ENCOUNTER — Encounter (HOSPITAL_COMMUNITY): Payer: Self-pay | Admitting: Radiology

## 2010-12-22 ENCOUNTER — Emergency Department (HOSPITAL_COMMUNITY): Payer: 59

## 2010-12-22 DIAGNOSIS — K2970 Gastritis, unspecified, without bleeding: Secondary | ICD-10-CM | POA: Insufficient documentation

## 2010-12-22 DIAGNOSIS — R1011 Right upper quadrant pain: Secondary | ICD-10-CM | POA: Insufficient documentation

## 2010-12-22 DIAGNOSIS — E7800 Pure hypercholesterolemia, unspecified: Secondary | ICD-10-CM | POA: Insufficient documentation

## 2010-12-22 DIAGNOSIS — K2990 Gastroduodenitis, unspecified, without bleeding: Secondary | ICD-10-CM | POA: Insufficient documentation

## 2010-12-22 DIAGNOSIS — I10 Essential (primary) hypertension: Secondary | ICD-10-CM | POA: Insufficient documentation

## 2010-12-22 DIAGNOSIS — F411 Generalized anxiety disorder: Secondary | ICD-10-CM | POA: Insufficient documentation

## 2010-12-22 DIAGNOSIS — Z79899 Other long term (current) drug therapy: Secondary | ICD-10-CM | POA: Insufficient documentation

## 2010-12-22 HISTORY — DX: Acquired absence of other specified parts of digestive tract: Z90.49

## 2010-12-22 HISTORY — DX: Pure hypercholesterolemia, unspecified: E78.00

## 2010-12-22 HISTORY — DX: Disorder of kidney and ureter, unspecified: N28.9

## 2010-12-22 HISTORY — DX: Acquired absence of both cervix and uterus: Z90.710

## 2010-12-22 LAB — URINALYSIS, ROUTINE W REFLEX MICROSCOPIC
Ketones, ur: NEGATIVE mg/dL
Leukocytes, UA: NEGATIVE
Nitrite: NEGATIVE
Specific Gravity, Urine: 1.004 — ABNORMAL LOW (ref 1.005–1.030)
pH: 7 (ref 5.0–8.0)

## 2010-12-22 LAB — COMPREHENSIVE METABOLIC PANEL
ALT: 19 U/L (ref 0–35)
AST: 17 U/L (ref 0–37)
Albumin: 4.2 g/dL (ref 3.5–5.2)
Alkaline Phosphatase: 67 U/L (ref 39–117)
BUN: 26 mg/dL — ABNORMAL HIGH (ref 6–23)
Chloride: 101 mEq/L (ref 96–112)
Potassium: 4.1 mEq/L (ref 3.5–5.1)
Total Bilirubin: 0.7 mg/dL (ref 0.3–1.2)

## 2010-12-22 LAB — CBC
HCT: 39.3 % (ref 36.0–46.0)
RDW: 13.1 % (ref 11.5–15.5)
WBC: 11.3 10*3/uL — ABNORMAL HIGH (ref 4.0–10.5)

## 2010-12-22 LAB — DIFFERENTIAL
Basophils Absolute: 0.1 10*3/uL (ref 0.0–0.1)
Eosinophils Relative: 1 % (ref 0–5)
Lymphocytes Relative: 30 % (ref 12–46)
Neutro Abs: 7.2 10*3/uL (ref 1.7–7.7)

## 2010-12-22 LAB — LIPASE, BLOOD: Lipase: 20 U/L (ref 11–59)

## 2010-12-29 ENCOUNTER — Other Ambulatory Visit: Payer: Self-pay | Admitting: Gastroenterology

## 2011-02-01 ENCOUNTER — Inpatient Hospital Stay (INDEPENDENT_AMBULATORY_CARE_PROVIDER_SITE_OTHER)
Admission: RE | Admit: 2011-02-01 | Discharge: 2011-02-01 | Disposition: A | Discharge status: Home / Self Care | Payer: 59 | Source: Ambulatory Visit | Attending: Emergency Medicine | Admitting: Emergency Medicine

## 2011-02-01 DIAGNOSIS — T7840XA Allergy, unspecified, initial encounter: Secondary | ICD-10-CM

## 2011-02-01 DIAGNOSIS — F411 Generalized anxiety disorder: Secondary | ICD-10-CM

## 2011-02-01 LAB — POCT I-STAT, CHEM 8
BUN: 26 mg/dL — ABNORMAL HIGH (ref 6–23)
Calcium, Ion: 1.19 mmol/L (ref 1.12–1.32)
Creatinine, Ser: 1.1 mg/dL (ref 0.50–1.10)
Glucose, Bld: 118 mg/dL — ABNORMAL HIGH (ref 70–99)
TCO2: 23 mmol/L (ref 0–100)

## 2011-02-09 ENCOUNTER — Other Ambulatory Visit (HOSPITAL_COMMUNITY): Payer: Self-pay | Admitting: Internal Medicine

## 2011-02-09 DIAGNOSIS — Z139 Encounter for screening, unspecified: Secondary | ICD-10-CM

## 2011-02-10 ENCOUNTER — Ambulatory Visit (HOSPITAL_COMMUNITY)
Admission: RE | Admit: 2011-02-10 | Discharge: 2011-02-10 | Disposition: A | Discharge status: Home / Self Care | Payer: 59 | Source: Ambulatory Visit | Attending: Internal Medicine | Admitting: Internal Medicine

## 2011-02-10 DIAGNOSIS — Z1231 Encounter for screening mammogram for malignant neoplasm of breast: Secondary | ICD-10-CM | POA: Insufficient documentation

## 2011-02-10 DIAGNOSIS — Z139 Encounter for screening, unspecified: Secondary | ICD-10-CM

## 2011-07-29 ENCOUNTER — Other Ambulatory Visit (HOSPITAL_COMMUNITY): Payer: Self-pay | Admitting: Family Medicine

## 2011-07-29 DIAGNOSIS — R1011 Right upper quadrant pain: Secondary | ICD-10-CM

## 2011-07-30 ENCOUNTER — Ambulatory Visit (HOSPITAL_COMMUNITY)
Admission: RE | Admit: 2011-07-30 | Discharge: 2011-07-30 | Disposition: A | Discharge status: Home / Self Care | Payer: 59 | Source: Ambulatory Visit | Attending: Family Medicine | Admitting: Family Medicine

## 2011-07-30 ENCOUNTER — Other Ambulatory Visit (HOSPITAL_COMMUNITY): Payer: Self-pay | Admitting: Family Medicine

## 2011-07-30 DIAGNOSIS — R1011 Right upper quadrant pain: Secondary | ICD-10-CM | POA: Insufficient documentation

## 2011-07-30 DIAGNOSIS — K7689 Other specified diseases of liver: Secondary | ICD-10-CM | POA: Insufficient documentation

## 2011-08-11 ENCOUNTER — Other Ambulatory Visit: Payer: Self-pay | Admitting: Endocrinology

## 2011-08-11 DIAGNOSIS — E049 Nontoxic goiter, unspecified: Secondary | ICD-10-CM

## 2011-08-12 ENCOUNTER — Ambulatory Visit
Admission: RE | Admit: 2011-08-12 | Discharge: 2011-08-12 | Disposition: A | Discharge status: Home / Self Care | Payer: 59 | Source: Ambulatory Visit | Attending: Endocrinology | Admitting: Endocrinology

## 2011-08-12 DIAGNOSIS — E049 Nontoxic goiter, unspecified: Secondary | ICD-10-CM

## 2011-12-31 ENCOUNTER — Emergency Department (HOSPITAL_COMMUNITY): Payer: 59

## 2011-12-31 ENCOUNTER — Encounter (HOSPITAL_COMMUNITY): Payer: Self-pay | Admitting: Emergency Medicine

## 2011-12-31 ENCOUNTER — Observation Stay (HOSPITAL_COMMUNITY)
Admission: EM | Admit: 2011-12-31 | Discharge: 2012-01-01 | Disposition: A | Discharge status: Home / Self Care | Payer: 59 | Attending: Emergency Medicine | Admitting: Emergency Medicine

## 2011-12-31 DIAGNOSIS — R079 Chest pain, unspecified: Principal | ICD-10-CM | POA: Insufficient documentation

## 2011-12-31 DIAGNOSIS — E785 Hyperlipidemia, unspecified: Secondary | ICD-10-CM | POA: Insufficient documentation

## 2011-12-31 DIAGNOSIS — I10 Essential (primary) hypertension: Secondary | ICD-10-CM | POA: Insufficient documentation

## 2011-12-31 DIAGNOSIS — Z87891 Personal history of nicotine dependence: Secondary | ICD-10-CM | POA: Insufficient documentation

## 2011-12-31 LAB — CBC WITH DIFFERENTIAL/PLATELET
Basophils Absolute: 0 10*3/uL (ref 0.0–0.1)
HCT: 41.9 % (ref 36.0–46.0)
Hemoglobin: 14.1 g/dL (ref 12.0–15.0)
Lymphocytes Relative: 23 % (ref 12–46)
Monocytes Absolute: 0.4 10*3/uL (ref 0.1–1.0)
Monocytes Relative: 6 % (ref 3–12)
Neutro Abs: 5.6 10*3/uL (ref 1.7–7.7)
WBC: 8 10*3/uL (ref 4.0–10.5)

## 2011-12-31 LAB — COMPREHENSIVE METABOLIC PANEL
AST: 17 U/L (ref 0–37)
Alkaline Phosphatase: 64 U/L (ref 39–117)
BUN: 21 mg/dL (ref 6–23)
CO2: 30 mEq/L (ref 19–32)
Chloride: 102 mEq/L (ref 96–112)
Creatinine, Ser: 0.99 mg/dL (ref 0.50–1.10)
GFR calc non Af Amer: 60 mL/min — ABNORMAL LOW (ref >90)
Total Bilirubin: 0.6 mg/dL (ref 0.3–1.2)

## 2011-12-31 LAB — POCT I-STAT TROPONIN I

## 2011-12-31 MED ORDER — ASPIRIN 325 MG PO TABS
325.0000 mg | ORAL_TABLET | ORAL | Status: AC
  Administered 2011-12-31: 325 mg via ORAL
  Filled 2011-12-31: qty 1

## 2011-12-31 MED ORDER — LORAZEPAM 1 MG PO TABS
1.0000 mg | ORAL_TABLET | Freq: Once | ORAL | Status: AC
  Administered 2011-12-31: 1 mg via ORAL
  Filled 2011-12-31: qty 1

## 2011-12-31 MED ORDER — GI COCKTAIL ~~LOC~~
30.0000 mL | Freq: Once | ORAL | Status: AC
  Administered 2011-12-31: 30 mL via ORAL
  Filled 2011-12-31: qty 30

## 2011-12-31 MED ORDER — LORAZEPAM 2 MG/ML IJ SOLN
0.5000 mg | Freq: Once | INTRAMUSCULAR | Status: AC
  Administered 2011-12-31: 0.5 mg via INTRAVENOUS
  Filled 2011-12-31: qty 1

## 2011-12-31 MED ORDER — SODIUM CHLORIDE 0.9 % IV SOLN
20.0000 mL | INTRAVENOUS | Status: DC
  Administered 2011-12-31: 20 mL via INTRAVENOUS

## 2011-12-31 NOTE — ED Provider Notes (Signed)
Patient in CDU under chest pain protocol.  Patient reports intermittent chest discomfort over the last several days as pressure like, occasionally constricting, located mid-sternal, as well as in both breasts.  Seems to be worse after eating.  No history of GERD.  Has seen gastroenterology with endoscopy and colonoscopy evaluation.  Patient resting comfortably at present with family at bedside.  Lungs CTA bilaterally.  S1/S2, RRR.  Abdomen soft, bowel sounds present.  Strong distal pulsed palpated all extremities. Monitor reveals NSR without ectopy.  12 lead without ischemic changes.  Negative cardiac markers.  Patient scheduled for stress echo in AM.  Treatment and diagnostic plan discussed with patient.  Jimmye Norman, NP 01/01/12 [phone]

## 2011-12-31 NOTE — ED Notes (Signed)
Pt c/o CP X 2 days, pt reports sometimes her CP in mid-sternal then radiates straight upwards into throat, then she has also had pain in her left breast, Pt describes the pain as pressure and throbbing. Pt also reports feeling constipated, has hx of constipation.

## 2011-12-31 NOTE — ED Provider Notes (Signed)
I saw and evaluated the patient, reviewed the resident's note and I agree with the findings including ECG and plan.  Intermittent Cp syndrome 2 days with activity and/or stress, cannot differentiate if stress/GI/ACS feel CP Obs reasonable.  L:CTA, Pt anxious, CP Obs explained and Pt told not to have IV dye so plan stress echo.  Hurman Horn, MD 01/02/12 [phone]

## 2011-12-31 NOTE — ED Notes (Signed)
Had upset stomach a couple of days ago  And then again today had upset stomach and then cp on and off for 3 mostly w/ upse stomach no n/v and sob after working in yard

## 2011-12-31 NOTE — ED Provider Notes (Signed)
History     CSN: [id number]  Arrival date & time 12/31/11  1406   None     Chief Complaint  Patient presents with  . Chest Pain    (Consider location/radiation/quality/duration/timing/severity/associated sxs/prior treatment) Patient is a 61 y.o. female presenting with chest pain. The history is provided by the patient.  Chest Pain Episode onset: 2 days ago. Episode Length: approx 30 min. Chest pain occurs intermittently. The chest pain is unchanged. Associated with: light exertion in the house. At its most intense, the pain is at 8/10. The pain is currently at 0/10. The severity of the pain is moderate. The quality of the pain is described as pressure-like. Radiates to: anterior neck. Pertinent negatives for primary symptoms include no fever, no fatigue, no shortness of breath, no cough, no abdominal pain, no nausea, no vomiting and no dizziness. She tried nothing for the symptoms. Risk factors: HTN, HLP, FH.     Past Medical History  Diagnosis Date  . Hypertension   . Hyperlipidemia   . Renal insufficiency   . History of hysterectomy   . Hx of cholecystectomy   . Hypercholesterolemia     Past Surgical History  Procedure Date  . Cesarean section   . Abdominal hysterectomy     No family history on file.  History  Substance Use Topics  . Smoking status: Former Games developer  . Smokeless tobacco: Not on file  . Alcohol Use: No    OB History    Grav Para Term Preterm Abortions TAB SAB Ect Mult Living                  Review of Systems  Constitutional: Negative for fever and fatigue.  HENT: Negative for congestion, drooling and neck pain.   Eyes: Negative for pain.  Respiratory: Negative for cough and shortness of breath.   Cardiovascular: Positive for chest pain.  Gastrointestinal: Negative for nausea, vomiting, abdominal pain and diarrhea.  Genitourinary: Negative for dysuria and hematuria.  Musculoskeletal: Negative for back pain and gait problem.  Skin: Negative  for color change.  Neurological: Negative for dizziness and headaches.  Hematological: Negative for adenopathy.  Psychiatric/Behavioral: Negative for behavioral problems.  All other systems reviewed and are negative.    Allergies  Ace inhibitors; Norvasc; Nsaids; and Omnipaque  Home Medications   Current Outpatient Rx  Name Route Sig Dispense Refill  . ALPRAZOLAM 1 MG PO TABS Oral Take 1 mg by mouth 3 (three) times daily as needed. For anxiety    . ASPIRIN EC 81 MG PO TBEC Oral Take 81 mg by mouth daily.    Marland Kitchen VITAMIN D 2000 UNITS PO TABS Oral Take 2,000 Units by mouth daily.      Marland Kitchen CINNAMON 500 MG PO TABS Oral Take 500 mg by mouth 2 (two) times daily.    Marland Kitchen DIAZEPAM 10 MG PO TABS Oral Take 10 mg by mouth daily as needed. For anxiety    . OMEGA-3 FATTY ACIDS 1000 MG PO CAPS Oral Take 1 g by mouth 2 (two) times daily.    Marland Kitchen LABETALOL HCL 100 MG PO TABS Oral Take 100 mg by mouth at bedtime.    . MULTI-VITAMIN/MINERALS PO TABS Oral Take 1 tablet by mouth daily.      Marland Kitchen PRAVASTATIN SODIUM 20 MG PO TABS Oral Take 20 mg by mouth at bedtime.    . ICAPS LUTEIN-ZEAXANTHIN PO Oral Take 1 tablet by mouth daily.    Marland Kitchen UBIQUINOL 100 MG PO CAPS Oral  Take 100 mg by mouth daily.      Marland Kitchen VALSARTAN 80 MG PO TABS Oral Take 80 mg by mouth daily.        BP 143/73  Pulse 72  Temp 98.8 F (37.1 C)  Resp 18  SpO2 98%  Physical Exam  Nursing note and vitals reviewed. Constitutional: She is oriented to person, place, and time. She appears well-developed and well-nourished.  HENT:  Head: Normocephalic.  Mouth/Throat: No oropharyngeal exudate.  Eyes: Conjunctivae and EOM are normal. Pupils are equal, round, and reactive to light.  Neck: Normal range of motion. Neck supple.  Cardiovascular: Normal rate, regular rhythm, normal heart sounds and intact distal pulses.  Exam reveals no gallop and no friction rub.   No murmur heard. Pulmonary/Chest: Effort normal and breath sounds normal. No respiratory  distress. She has no wheezes.  Abdominal: Soft. Bowel sounds are normal. There is no tenderness. There is no rebound and no guarding.  Musculoskeletal: Normal range of motion. She exhibits no edema and no tenderness.  Neurological: She is alert and oriented to person, place, and time.  Skin: Skin is warm and dry.  Psychiatric: She has a normal mood and affect. Her behavior is normal.    ED Course  Procedures (including critical care time)  Labs Reviewed  COMPREHENSIVE METABOLIC PANEL - Abnormal; Notable for the following:    Glucose, Bld 106 (*)     Calcium 10.6 (*)     GFR calc non Af Amer 60 (*)     GFR calc Af Amer 70 (*)     All other components within normal limits  CBC WITH DIFFERENTIAL  POCT I-STAT TROPONIN I  TROPONIN I  TROPONIN I   Dg Chest 2 View  12/31/2011  *RADIOLOGY REPORT*  Clinical Data: Chest pain  CHEST - 2 VIEW  Comparison:  12/07/2008  Findings:  The heart size and mediastinal contours are within normal limits.  Both lungs are clear.  The visualized skeletal structures are unremarkable.  Prior cholecystectomy evident.  Degenerative changes of the thoracic spine.  No compression fracture.  IMPRESSION: No active cardiopulmonary disease.   Original Report Authenticated By: Judie Petit. Ruel Favors, M.D.      1. Chest pain       Date: 12/31/2011  Rate: 83  Rhythm: normal sinus rhythm  QRS Axis: normal  Intervals: normal  ST/T Wave abnormalities: normal  Conduction Disutrbances:none  Narrative Interpretation: Normal sinus rhythm, no ectopy noted, No ST or T wave changes cw ischemia.   Old EKG Reviewed: changes noted   MDM  11:27 PM 61 y.o. female w hx of HTN, HLP, FH of cardiac dis who pw retrosternal chest pressure radiating to her neck intermittently for the last 2 days. Pt AFVSS here, appears well on exam, no current cp. Interp/reviewed labs/imaging which are thus far non-contrib. Plan for admission for cp r/o.   Will send to CDU for cp r/o. Pt has possible  allergy to contrast dye. Will plan for stress echo.   Clinical Impression 1. Chest pain          Purvis Sheffield, MD 12/31/11 [phone]

## 2012-01-01 DIAGNOSIS — R072 Precordial pain: Secondary | ICD-10-CM

## 2012-01-01 MED ORDER — OXYCODONE-ACETAMINOPHEN 5-325 MG PO TABS
1.0000 | ORAL_TABLET | Freq: Once | ORAL | Status: AC
  Administered 2012-01-01: 1 via ORAL
  Filled 2012-01-01 (×2): qty 1

## 2012-01-01 MED ORDER — HYDROCODONE-ACETAMINOPHEN 5-500 MG PO TABS: 1.0000 | ORAL_TABLET | Freq: Four times a day (QID) | ORAL | Status: AC | PRN

## 2012-01-01 NOTE — Progress Notes (Signed)
Observation review is complete. 

## 2012-01-01 NOTE — ED Notes (Signed)
PT reports havinf LT sided CP ,EKG repeated.

## 2012-01-01 NOTE — ED Provider Notes (Signed)
Medical screening examination/treatment/procedure(s) were performed by non-physician practitioner and as supervising physician I was immediately available for consultation/collaboration.  Flint Melter, MD 01/01/12 [phone]

## 2012-01-01 NOTE — Progress Notes (Signed)
  Echocardiogram Echocardiogram Stress Test has been performed.  Maryann Mccall 01/01/2012, 9:25 AM

## 2012-01-01 NOTE — ED Provider Notes (Signed)
Patient placed in the CDU on chest pain protocol. Patient has stayed here overnight and is scheduled to get an echocardiogram gram stress test. The patient has come back from stress test and I have spoken to the radiologist who informs me that the patient had no abnormalities on stress exam. The patient has remained pain-free overnight. Just prior to discharge the patient did endorse some mild chest pain which she was treated with Percocet and it resolved. Patient being instructed to followup with her primary care Doctor. Have discussed results and findings of her testing with her and answered all her questions.   [facility]* *[facility]* [address] [place], [place] [address] [phone]  ------------------------------------------------------------ Stress Echocardiography  Patient: [name], [name] MR #: [id number] Study Date: 01/01/2012 Gender: F Age: 61 Height: 167.6cm Weight: 82.7kg BSA: 1.107m^2 Pt. Status: Room: CD02C  PERFORMING [place], [facility] Wayland Salinas SONOGRAPHER Melissa Morford, RDCS cc:  ------------------------------------------------------------  ------------------------------------------------------------ Indications: Chest pain 786.51.  ------------------------------------------------------------ Study Conclusions  - Stress ECG conclusions: There were no stress arrhythmias or conduction abnormalities. The stress ECG was negative for ischemia. - Staged echo: There was no echocardiographic evidence for stress-induced ischemia. Bruce protocol. Stress echocardiography. Height: Height: 167.6cm. Height: 66in. Weight: Weight: 82.7kg. Weight: 182lb. Body mass index: BMI: 29.4kg/m^2. Body surface area: BSA: 1.23m^2. Blood pressure: 131/76. Patient status: Observation.  ------------------------------------------------------------  ------------------------------------------------------------ Baseline ECG:  Normal.  ------------------------------------------------------------ Stress protocol:  +---------------+---+------------+--------+ Stage HR BP (mmHg) Symptoms +---------------+---+------------+--------+ Baseline 89 129/69 (89) None  +---------------+---+------------+--------+ Stage 1 106129/69 (89) -------- +---------------+---+------------+--------+ Stage 2 134190/88 (122)-------- +---------------+---+------------+--------+ Stage 3 146-------------------- +---------------+---+------------+--------+ Recovery; 1 [id number] (125)-------- +---------------+---+------------+--------+ Recovery; 2 min104-------------------- +---------------+---+------------+--------+ Recovery; 3 min96 161/81 (108)-------- +---------------+---+------------+--------+ Recovery; 4 min90 -------------------- +---------------+---+------------+--------+ Recovery; 5 min94 154/84 (107)-------- +---------------+---+------------+--------+  ------------------------------------------------------------ Stress results: Maximal heart rate during stress was 146bpm (91% of maximal predicted heart rate). The maximal predicted heart rate was 146bpm.The target heart rate was achieved. The heart rate response to stress was normal. There was a normal resting blood pressure with an appropriate response to stress. The rate-pressure product for the peak heart rate and blood pressure was Hg/min. The patient experienced no chest pain during stress.  ------------------------------------------------------------ Stress ECG: There were no stress arrhythmias or conduction abnormalities. The stress ECG was negative for ischemia.  ------------------------------------------------------------ Baseline:  - LV size was normal. - Normal wall motion; no LV regional wall motion abnormalities. Peak  stress:  ------------------------------------------------------------ Stress echo results: Left ventricular ejection fraction was normal at rest and with stress. There was no echocardiographic evidence for stress-induced ischemia.  ------------------------------------------------------------ Prepared and Electronically Authenticated by  Charlton Haws 2013-08-30T10:10:37.507  Pt has been advised of the symptoms that warrant their return to the ED. Patient has voiced understanding and has agreed to follow-up with the PCP or specialist.    Dorthula Matas, PA 01/01/12 1058

## 2012-01-01 NOTE — ED Notes (Signed)
PT to stress echo.

## 2012-01-02 NOTE — ED Provider Notes (Signed)
Medical screening examination/treatment/procedure(s) were conducted as a shared visit with non-physician practitioner(s) and myself.  I personally evaluated the patient during the encounter  Hurman Horn, MD 01/02/12 [phone]

## 2012-01-26 ENCOUNTER — Ambulatory Visit: Payer: 59 | Admitting: Cardiovascular Disease

## 2012-02-05 ENCOUNTER — Ambulatory Visit: Payer: 59 | Admitting: Cardiovascular Disease

## 2012-02-10 ENCOUNTER — Ambulatory Visit: Payer: 59 | Admitting: Cardiovascular Disease

## 2012-03-15 ENCOUNTER — Ambulatory Visit: Payer: 59 | Admitting: Cardiovascular Disease

## 2012-03-23 ENCOUNTER — Emergency Department (HOSPITAL_COMMUNITY)
Admission: EM | Admit: 2012-03-23 | Discharge: 2012-03-23 | Disposition: A | Discharge status: Home / Self Care | Payer: 59 | Attending: Emergency Medicine | Admitting: Emergency Medicine

## 2012-03-23 ENCOUNTER — Emergency Department (HOSPITAL_COMMUNITY): Payer: 59

## 2012-03-23 ENCOUNTER — Encounter (HOSPITAL_COMMUNITY): Payer: Self-pay | Admitting: Physical Medicine and Rehabilitation

## 2012-03-23 DIAGNOSIS — I10 Essential (primary) hypertension: Secondary | ICD-10-CM | POA: Insufficient documentation

## 2012-03-23 DIAGNOSIS — F329 Major depressive disorder, single episode, unspecified: Secondary | ICD-10-CM | POA: Insufficient documentation

## 2012-03-23 DIAGNOSIS — R0789 Other chest pain: Secondary | ICD-10-CM | POA: Insufficient documentation

## 2012-03-23 DIAGNOSIS — E7800 Pure hypercholesterolemia, unspecified: Secondary | ICD-10-CM | POA: Insufficient documentation

## 2012-03-23 DIAGNOSIS — F3289 Other specified depressive episodes: Secondary | ICD-10-CM | POA: Insufficient documentation

## 2012-03-23 DIAGNOSIS — R42 Dizziness and giddiness: Secondary | ICD-10-CM | POA: Insufficient documentation

## 2012-03-23 DIAGNOSIS — Z87891 Personal history of nicotine dependence: Secondary | ICD-10-CM | POA: Insufficient documentation

## 2012-03-23 DIAGNOSIS — Z79899 Other long term (current) drug therapy: Secondary | ICD-10-CM | POA: Insufficient documentation

## 2012-03-23 DIAGNOSIS — E785 Hyperlipidemia, unspecified: Secondary | ICD-10-CM | POA: Insufficient documentation

## 2012-03-23 DIAGNOSIS — F430 Acute stress reaction: Secondary | ICD-10-CM | POA: Insufficient documentation

## 2012-03-23 DIAGNOSIS — F411 Generalized anxiety disorder: Secondary | ICD-10-CM | POA: Insufficient documentation

## 2012-03-23 DIAGNOSIS — Z87448 Personal history of other diseases of urinary system: Secondary | ICD-10-CM | POA: Insufficient documentation

## 2012-03-23 DIAGNOSIS — Z7982 Long term (current) use of aspirin: Secondary | ICD-10-CM | POA: Insufficient documentation

## 2012-03-23 DIAGNOSIS — F419 Anxiety disorder, unspecified: Secondary | ICD-10-CM

## 2012-03-23 LAB — CBC WITH DIFFERENTIAL/PLATELET
Basophils Relative: 1 % (ref 0–1)
Eosinophils Absolute: 0.1 10*3/uL (ref 0.0–0.7)
HCT: 38.9 % (ref 36.0–46.0)
Hemoglobin: 13 g/dL (ref 12.0–15.0)
Lymphs Abs: 2.1 10*3/uL (ref 0.7–4.0)
MCH: 29 pg (ref 26.0–34.0)
MCHC: 33.4 g/dL (ref 30.0–36.0)
MCV: 86.8 fL (ref 78.0–100.0)
Monocytes Absolute: 0.4 10*3/uL (ref 0.1–1.0)
Monocytes Relative: 5 % (ref 3–12)
Neutrophils Relative %: 65 % (ref 43–77)
RBC: 4.48 MIL/uL (ref 3.87–5.11)

## 2012-03-23 LAB — COMPREHENSIVE METABOLIC PANEL
Alkaline Phosphatase: 61 U/L (ref 39–117)
BUN: 25 mg/dL — ABNORMAL HIGH (ref 6–23)
Creatinine, Ser: 0.8 mg/dL (ref 0.50–1.10)
GFR calc Af Amer: >90 mL/min (ref >90)
Glucose, Bld: 115 mg/dL — ABNORMAL HIGH (ref 70–99)
Potassium: 4.4 mEq/L (ref 3.5–5.1)
Total Bilirubin: 0.4 mg/dL (ref 0.3–1.2)
Total Protein: 7.1 g/dL (ref 6.0–8.3)

## 2012-03-23 LAB — POCT I-STAT TROPONIN I: Troponin i, poc: 0 ng/mL (ref 0.00–0.08)

## 2012-03-23 MED ORDER — ALPRAZOLAM 1 MG PO TABS: 1.0000 mg | ORAL_TABLET | Freq: Three times a day (TID) | ORAL | Status: DC

## 2012-03-23 MED ORDER — BUSPIRONE HCL 5 MG PO TABS: 10.0000 mg | ORAL_TABLET | Freq: Two times a day (BID) | ORAL | Status: DC

## 2012-03-23 MED ORDER — ESCITALOPRAM OXALATE 10 MG PO TABS: 10.0000 mg | ORAL_TABLET | Freq: Every day | ORAL | Status: DC

## 2012-03-23 MED ORDER — LORAZEPAM 1 MG PO TABS
2.0000 mg | ORAL_TABLET | Freq: Once | ORAL | Status: AC
  Administered 2012-03-23: 2 mg via ORAL
  Filled 2012-03-23: qty 2

## 2012-03-23 NOTE — ED Notes (Signed)
Called telepsych stated there are a lot of consults today and should call back within 60 minutes.

## 2012-03-23 NOTE — ED Provider Notes (Signed)
Telepsyc complete and results given to Dr. Radford Pax.  Patient to be discharged with Xanax, Lexapro and BuSpar. She is to followup with Elfredia Nevins for further evaluation..  Patient states she is feeling better and is ready to go home.    Dahlia Client Retta Pitcher, PA-C 03/23/12 2355

## 2012-03-23 NOTE — ED Notes (Signed)
Rhonda Radiologist  Called for CT Scan results

## 2012-03-23 NOTE — ED Provider Notes (Signed)
History     CSN: [id number]  Arrival date & time 03/23/12  1116   First MD Initiated Contact with Patient 03/23/12 1142      Chief Complaint  Patient presents with  . Palpitations  . Hypertension    (Consider location/radiation/quality/duration/timing/severity/associated sxs/prior treatment) HPI [name] is a 61 y.o. female who presents with complaint of anxiety, stress, depression, chest pain and palpitations. Pt is here with her son who is providing a lot of history. Pt is anxious day and night about everything. She has recently been very anxious about her blood pressure. Pt states she wakes up, feels palpitations, takes her BP and it is high. She has been following up with her doctor who is giving her xanax and valium for her anxiety. Pt states she is afraid of taking any anti depressants because she has read on about all side effects. Pt states she has an appointment with psychiatrist on  Dec 26th, per son, "i dont think she will make it till then."  Pt has no current pain. States she is very anxious about all of the results.    Past Medical History  Diagnosis Date  . Hypertension   . Hyperlipidemia   . Renal insufficiency   . History of hysterectomy   . Hx of cholecystectomy   . Hypercholesterolemia     Past Surgical History  Procedure Date  . Cesarean section   . Abdominal hysterectomy     No family history on file.  History  Substance Use Topics  . Smoking status: Former Games developer  . Smokeless tobacco: Not on file  . Alcohol Use: No    OB History    Grav Para Term Preterm Abortions TAB SAB Ect Mult Living                  Review of Systems  Constitutional: Negative for fever and chills.  HENT: Negative.   Respiratory: Positive for chest tightness.   Cardiovascular: Positive for chest pain and palpitations.  Gastrointestinal: Negative.   Musculoskeletal: Negative.   Skin: Negative.   Neurological: Positive for dizziness. Negative for weakness and  numbness.  Psychiatric/Behavioral: The patient is nervous/anxious.     Allergies  Ace inhibitors; Norvasc; Nsaids; and Omnipaque  Home Medications   Current Outpatient Rx  Name  Route  Sig  Dispense  Refill  . ALPRAZOLAM 1 MG PO TABS   Oral   Take 1 mg by mouth 3 (three) times daily as needed. For anxiety         . AMOXICILLIN 875 MG PO TABS   Oral   Take 875 mg by mouth 2 (two) times daily. Started 11/15 For 7 days         . ASPIRIN EC 81 MG PO TBEC   Oral   Take 81 mg by mouth daily.         Marland Kitchen VITAMIN D 2000 UNITS PO TABS   Oral   Take 2,000 Units by mouth daily.           Marland Kitchen CINNAMON 500 MG PO TABS   Oral   Take 500 mg by mouth 2 (two) times daily.         . CO Q 10 100 MG PO CAPS   Oral   Take 1 tablet by mouth daily.         Marland Kitchen DIAZEPAM 10 MG PO TABS   Oral   Take 10 mg by mouth daily as needed. For anxiety         .  DOCUSATE SODIUM 100 MG PO CAPS   Oral   Take 100 mg by mouth 2 (two) times daily.         . OMEGA-3 FATTY ACIDS 1000 MG PO CAPS   Oral   Take 1 g by mouth daily.          . GUAIFENESIN ER 600 MG PO TB12   Oral   Take 1,200 mg by mouth 2 (two) times daily.         Marland Kitchen LABETALOL HCL 100 MG PO TABS   Oral   Take 200 mg by mouth 2 (two) times daily. Afternoon and bedtime         . MULTI-VITAMIN/MINERALS PO TABS   Oral   Take 1 tablet by mouth daily.           Marland Kitchen PRAVASTATIN SODIUM 20 MG PO TABS   Oral   Take 40 mg by mouth at bedtime.         . ICAPS LUTEIN-ZEAXANTHIN PO   Oral   Take 1 tablet by mouth daily.         Marland Kitchen VALSARTAN 80 MG PO TABS   Oral   Take 80 mg by mouth daily.             BP 107/68  Pulse 76  Temp 98.5 F (36.9 C) (Oral)  Resp 16  SpO2 96%  Physical Exam  Nursing note and vitals reviewed. Constitutional: She is oriented to person, place, and time. She appears well-developed and well-nourished.       Pt is crying  Eyes: Conjunctivae normal are normal.  Neck: Neck supple.    Cardiovascular: Normal rate, regular rhythm and normal heart sounds.   Pulmonary/Chest: Effort normal and breath sounds normal. No respiratory distress. She has no wheezes. She has no rales.  Abdominal: Soft. Bowel sounds are normal. She exhibits no distension. There is no tenderness. There is no rebound.  Musculoskeletal: She exhibits no edema.  Neurological: She is alert and oriented to person, place, and time. No cranial nerve deficit. Coordination normal.  Skin: Skin is warm and dry. No rash noted. No erythema.  Psychiatric:       Pt very anxious, tearful    ED Course  Procedures (including critical care time)  Pt with palpitations, chest pressure, anxiety. Per son, pt constatly anxious and has these symptoms of chest pain and skin burning with anxiety. Will get labs, chest x0ray.   Results for orders placed during the hospital encounter of 03/23/12  CBC WITH DIFFERENTIAL      Component Value Range   WBC 7.5  4.0 - 10.5 K/uL   RBC 4.48  3.87 - 5.11 MIL/uL   Hemoglobin 13.0  12.0 - 15.0 g/dL   HCT 95.2  84.1 - 32.4 %   MCV 86.8  78.0 - 100.0 fL   MCH 29.0  26.0 - 34.0 pg   MCHC 33.4  30.0 - 36.0 g/dL   RDW 40.1  02.7 - 25.3 %   Platelets 215  150 - 400 K/uL   Neutrophils Relative 65  43 - 77 %   Neutro Abs 4.8  1.7 - 7.7 K/uL   Lymphocytes Relative 29  12 - 46 %   Lymphs Abs 2.1  0.7 - 4.0 K/uL   Monocytes Relative 5  3 - 12 %   Monocytes Absolute 0.4  0.1 - 1.0 K/uL   Eosinophils Relative 2  0 - 5 %   Eosinophils Absolute 0.1  0.0 - 0.7 K/uL   Basophils Relative 1  0 - 1 %   Basophils Absolute 0.1  0.0 - 0.1 K/uL  COMPREHENSIVE METABOLIC PANEL      Component Value Range   Sodium 141  135 - 145 mEq/L   Potassium 4.4  3.5 - 5.1 mEq/L   Chloride 105  96 - 112 mEq/L   CO2 28  19 - 32 mEq/L   Glucose, Bld 115 (*) 70 - 99 mg/dL   BUN 25 (*) 6 - 23 mg/dL   Creatinine, Ser 1.61  0.50 - 1.10 mg/dL   Calcium 9.9  8.4 - 09.6 mg/dL   Total Protein 7.1  6.0 - 8.3 g/dL    Albumin 4.0  3.5 - 5.2 g/dL   AST 21  0 - 37 U/L   ALT 25  0 - 35 U/L   Alkaline Phosphatase 61  39 - 117 U/L   Total Bilirubin 0.4  0.3 - 1.2 mg/dL   GFR calc non Af Amer 78 (*) >90 mL/min   GFR calc Af Amer >90  >90 mL/min  POCT I-STAT TROPONIN I      Component Value Range   Troponin i, poc 0.00  0.00 - 0.08 ng/mL   Comment 3            Dg Chest 2 View  03/23/2012  *RADIOLOGY REPORT*  Clinical Data: Weakness and shortness of breath.  Left chest pain.  CHEST - 2 VIEW  Comparison: 12/31/2011 and 12/07/2008  Findings: Two-view exam shows no edema or focal airspace consolidation.  No pleural effusion.  A new small nodular density projects over the left apex. The cardiopericardial silhouette is within normal limits for size. Imaged bony structures of the thorax are intact.  IMPRESSION: New small nodular density at the left apex.  CT chest without contrast recommended to further evaluate.  Otherwise stable exam.   Original Report Authenticated By: Kennith Center, M.D.      Date: 03/23/2012  Rate: 82  Rhythm: normal sinus rhythm  QRS Axis: normal  Intervals: normal  ST/T Wave abnormalities: normal  Conduction Disutrbances: none  Narrative Interpretation:   Old EKG Reviewed: No significant changes noted  3:41 PM Pt continues to cry, constantly, very depressed anxious. Ativan ordered. Will get psychiatric consult. Also x-ray with a nodule, will get CT.     Ct Chest Wo Contrast  03/23/2012  *RADIOLOGY REPORT*  Clinical Data: Left apical nodule seen on chest x-ray earlier today.  Heart palpitations with hypertension  CT CHEST WITHOUT CONTRAST  Technique:  Multidetector CT imaging of the chest was performed following the standard protocol without IV contrast.  Comparison: Chest x-ray from earlier today.  Neck CT from 03/14/2008.  Findings: There is no axillary lymphadenopathy.  9 mm left thyroid nodule noted.  No supraclavicular lymphadenopathy.  2.6 cm fluid density structure identified in the  right paratracheal space.  This could represent some focal fluid in a superior pericardial recess or mediastinal cyst.  The lesion was present on the study from 4 years ago and is unchanged, consistent with a benign process.  There is no mediastinal or hilar lymphadenopathy. Heart size is normal. Coronary artery calcification is noted. No pericardial or pleural effusion.  The lung windows show no evidence for a pulmonary nodule in the left apex.  The appearance on the chest x-ray is likely related to some benign-appearing sclerotic change in the mid and anterior left first rib. There is some minimal compressive atelectasis in  the dependent lower lobes bilaterally.  The lungs are otherwise clear.  Bone windows reveal no worrisome lytic or sclerotic osseous lesions.  IMPRESSION: No pulmonary parenchymal nodule or mass.  The tiny nodular density seen in the left apex on the chest x-ray earlier today is likely secondary to some benign sclerotic change in the mid and anterior left first rib.   Original Report Authenticated By: Kennith Center, M.D.     5:17 PM CT negative, explained to pt. Awaiting  Psychiatry consult. Signed out with Dr. Radford Pax at shift change   No diagnosis found.    MDM          Lottie Mussel, PA 03/26/12 [phone]

## 2012-03-23 NOTE — ED Notes (Signed)
Fax and called telepsych, they will call in about 1Hr from 1420

## 2012-03-23 NOTE — ED Notes (Addendum)
Pt reports BP up and down over the weekend, ranging low 140/80 high 190/90 with tingling on arms bilaterally. Pt reports slight headache over the weekend but denies headache nausea now. Per husband, pt feels anxious and he noted changes in pt mood and family Primary care provider recommend psych consult. Pt refused to see psychiatrist and now agree to see psychiatrist, appointment with Dr. Margaretmary Lombard 04/25/2012.

## 2012-03-23 NOTE — ED Notes (Signed)
Pt presents to department for evaluation of palpitations and hypertension. Ongoing x3 days. Pt states she can feel her heart racing when she wakes up, also states recent change in BP medication (labetalol). Denies chest pain at the time. Respirations unlabored. Skin warm and dry. Pt is conscious alert and oriented x4.

## 2012-03-24 ENCOUNTER — Other Ambulatory Visit: Payer: Self-pay | Admitting: Internal Medicine

## 2012-03-24 DIAGNOSIS — Z1231 Encounter for screening mammogram for malignant neoplasm of breast: Secondary | ICD-10-CM

## 2012-03-26 NOTE — ED Provider Notes (Signed)
Medical screening examination/treatment/procedure(s) were performed by non-physician practitioner and as supervising physician I was immediately available for consultation/collaboration.  Siniyah Evangelist, MD 03/26/12 0207 

## 2012-05-03 ENCOUNTER — Ambulatory Visit: Payer: 59

## 2012-05-11 ENCOUNTER — Other Ambulatory Visit: Payer: Self-pay | Admitting: Family Medicine

## 2012-05-11 DIAGNOSIS — N649 Disorder of breast, unspecified: Secondary | ICD-10-CM

## 2012-05-17 ENCOUNTER — Ambulatory Visit
Admission: RE | Admit: 2012-05-17 | Discharge: 2012-05-17 | Disposition: A | Discharge status: Home / Self Care | Payer: 59 | Source: Ambulatory Visit | Attending: Family Medicine | Admitting: Family Medicine

## 2012-05-17 DIAGNOSIS — N649 Disorder of breast, unspecified: Secondary | ICD-10-CM

## 2012-05-18 ENCOUNTER — Ambulatory Visit: Payer: 59

## 2012-06-18 ENCOUNTER — Other Ambulatory Visit: Payer: Self-pay

## 2012-10-27 ENCOUNTER — Other Ambulatory Visit: Payer: Self-pay | Admitting: Endocrinology

## 2012-10-27 DIAGNOSIS — E049 Nontoxic goiter, unspecified: Secondary | ICD-10-CM

## 2012-10-31 ENCOUNTER — Ambulatory Visit
Admission: RE | Admit: 2012-10-31 | Discharge: 2012-10-31 | Disposition: A | Discharge status: Home / Self Care | Payer: 59 | Source: Ambulatory Visit | Attending: Endocrinology | Admitting: Endocrinology

## 2012-10-31 DIAGNOSIS — E049 Nontoxic goiter, unspecified: Secondary | ICD-10-CM

## 2012-11-03 ENCOUNTER — Other Ambulatory Visit: Payer: Self-pay | Admitting: Endocrinology

## 2012-11-03 DIAGNOSIS — E049 Nontoxic goiter, unspecified: Secondary | ICD-10-CM

## 2012-12-19 ENCOUNTER — Ambulatory Visit (INDEPENDENT_AMBULATORY_CARE_PROVIDER_SITE_OTHER): Payer: 59 | Admitting: Cardiovascular Disease

## 2012-12-19 ENCOUNTER — Encounter: Payer: Self-pay | Admitting: Cardiovascular Disease

## 2012-12-19 VITALS — BP 130/86 | HR 76 | Ht 66.0 in | Wt 170.0 lb

## 2012-12-19 DIAGNOSIS — R079 Chest pain, unspecified: Secondary | ICD-10-CM

## 2012-12-19 DIAGNOSIS — R002 Palpitations: Secondary | ICD-10-CM

## 2012-12-19 NOTE — Patient Instructions (Addendum)
Your physician recommends that you schedule a follow-up appointment as needed with Cardiology.   Your physician recommends that you continue on your current medications as directed. Please refer to the Current Medication list given to you today.  Your physician has recommended that you wear a 48 holter monitor. Holter monitors are medical devices that record the heart's electrical activity. Doctors most often use these monitors to diagnose arrhythmias. Arrhythmias are problems with the speed or rhythm of the heartbeat. The monitor is a small, portable device. You can wear one while you do your normal daily activities. This is usually used to diagnose what is causing palpitations/syncope (passing out).

## 2012-12-19 NOTE — Progress Notes (Signed)
HPI:  62 year-old woman presenting for initial cardiac evaluation. She is here for evaluation of chest pain. Approximately one year ago she had 2 episodes of chest pain prompting emergency room evaluation. She developed the onset of heart racing and chest pain with shortness of breath. This was thought to be related to anxiety and panic attacks. She was evaluated with an exercise stress echocardiogram that demonstrated no abnormalities. She's had a CT scan of the chest to evaluate a pulmonary nodule seen on chest x-ray. This demonstrated incidental notation of coronary calcification.  The patient's symptoms have improved at this point. She's had no further episodes of chest pain, but has had some palpitations. She has a lot of problems with anxiety. She has recently seen a psychiatrist. She's previously been on Lexapro. She restarted this but it has now been changed to Zoloft. She's also taking clonazepam 3 times a day. She feels like this has helped her a lot. She's been reluctant to take this long-term over fear of becoming "addicted."  She otherwise feels well. She's lost 70 pounds through diet program where she is followed a low-carb program. She exercises without exertional symptoms. She denies shortness of breath, orthopnea, or PND.  She would like to discontinue some of her medications. She asks about whether she can stop pravastatin. She's read a lot of bad things about statin drugs. She also complains of muscle aching in her arms. She also would like to discontinue labetalol because she feels fatigued. Her blood pressure generally runs in the 110-120 range.  Outpatient Encounter Prescriptions as of 12/19/2012  Medication Sig Dispense Refill  . Cinnamon 500 MG TABS Take 500 mg by mouth 2 (two) times daily.      . clonazePAM (KLONOPIN) 0.5 MG tablet TAKE 0.25MG  IN THE MORNING, 0.25MG  IN THE AFTERNOON AND 0.50MG   AT BEDTIME      . Coenzyme Q10 (COQ10) 100 MG CAPS Take by mouth daily.      .  fish oil-omega-3 fatty acids 1000 MG capsule Take 1 g by mouth daily.       Marland Kitchen labetalol (NORMODYNE) 100 MG tablet Take 100 mg by mouth daily.      . Multiple Vitamins-Minerals (MULTIVITAMIN WITH MINERALS) tablet Take 1 tablet by mouth daily.        . Polyethylene Glycol 3350 (MIRALAX PO) Take by mouth.      . pravastatin (PRAVACHOL) 20 MG tablet Take 40 mg by mouth at bedtime.      . sertraline (ZOLOFT) 100 MG tablet TAKE 150MG  TABLET DAILY AT BEDTIME      . Specialty Vitamins Products (ICAPS LUTEIN-ZEAXANTHIN PO) Take 1 tablet by mouth daily.      . valsartan (DIOVAN) 80 MG tablet Take 80 mg by mouth daily.        Marland Kitchen ZETIA 10 MG tablet TAKE ONE TABLET DAILY      . [DISCONTINUED] ALPRAZolam (XANAX) 1 MG tablet Take 1 mg by mouth 3 (three) times daily as needed. For anxiety      . [DISCONTINUED] ALPRAZolam (XANAX) 1 MG tablet Take 1 tablet (1 mg total) by mouth 3 (three) times daily.  90 tablet  0  . [DISCONTINUED] amoxicillin (AMOXIL) 875 MG tablet Take 875 mg by mouth 2 (two) times daily. Started 11/15 For 7 days      . [DISCONTINUED] aspirin EC 81 MG tablet Take 81 mg by mouth daily.      . [DISCONTINUED] busPIRone (BUSPAR) 5 MG tablet Take 2 tablets (  10 mg total) by mouth 2 (two) times daily between meals.  60 tablet  0  . [DISCONTINUED] Cholecalciferol (VITAMIN D) 2000 UNITS tablet Take 2,000 Units by mouth daily.        . [DISCONTINUED] Coenzyme Q10 (CO Q 10) 100 MG CAPS Take 1 tablet by mouth daily.      . [DISCONTINUED] diazepam (VALIUM) 10 MG tablet Take 10 mg by mouth daily as needed. For anxiety      . [DISCONTINUED] docusate sodium (COLACE) 100 MG capsule Take 100 mg by mouth 2 (two) times daily.      . [DISCONTINUED] escitalopram (LEXAPRO) 10 MG tablet Take 1 tablet (10 mg total) by mouth daily.  30 tablet  0  . [DISCONTINUED] guaiFENesin (MUCINEX) 600 MG 12 hr tablet Take 1,200 mg by mouth 2 (two) times daily.      . [DISCONTINUED] labetalol (NORMODYNE) 100 MG tablet Take 200 mg by  mouth 2 (two) times daily. Afternoon and bedtime       No facility-administered encounter medications on file as of 12/19/2012.    Ace inhibitors; Norvasc; Nsaids; and Omnipaque  Past Medical History  Diagnosis Date  . Hypertension   . Hyperlipidemia   . Renal insufficiency   . History of hysterectomy   . Hx of cholecystectomy   . Hypercholesterolemia     Past Surgical History  Procedure Laterality Date  . Cesarean section    . Abdominal hysterectomy      History   Social History  . Marital Status: Married    Spouse Name: N/A    Number of Children: N/A  . Years of Education: N/A   Occupational History  . Not on file.   Social History Main Topics  . Smoking status: Former Games developer  . Smokeless tobacco: Not on file  . Alcohol Use: No  . Drug Use: No  . Sexual Activity: No   Other Topics Concern  . Not on file   Social History Narrative  . No narrative on file    Family history: Her father had a myocardial infarction at age 28. He was a heavy smoker. No other coronary artery disease in the family.  ROS:  General: no fevers/chills/night sweats Eyes: no blurry vision, diplopia, or amaurosis ENT: no sore throat or hearing loss Resp: no cough, wheezing, or hemoptysis CV: no edema  GI: no abdominal pain, nausea, vomiting, diarrhea, or constipation GU: no dysuria, frequency, or hematuria Skin: no rash Neuro: no headache, numbness, tingling, or weakness of extremities Musculoskeletal: no joint pain or swelling Heme: no bleeding, DVT, or easy bruising Endo: no polydipsia or polyuria  BP 130/86  Pulse 76  Ht 5\' 6"  (1.676 m)  Wt 77.111 kg (170 lb)  BMI 27.45 kg/m2  PHYSICAL EXAM: Pt is alert and oriented, WD, WN, in no distress. HEENT: normal Neck: JVP normal. Carotid upstrokes normal without bruits. No thyromegaly. Lungs: equal expansion, clear bilaterally CV: Apex is discrete and nondisplaced, RRR without murmur or gallop Abd: soft, NT, +BS, no bruit, no  hepatosplenomegaly Back: no CVA tenderness Ext: no C/C/E        DP/PT pulses intact and = Skin: warm and dry without rash Neuro: CNII-XII intact             Strength intact = bilaterally  EKG:  Normal sinus rhythm 76 beats per minute, within normal limits.  Stress echocardiogram 01/01/2012: Study Conclusions  - Stress ECG conclusions: There were no stress arrhythmias or conduction abnormalities. The stress ECG  was negative for ischemia. - Staged echo: There was no echocardiographic evidence for stress-induced ischemia.  ASSESSMENT AND PLAN: Chest pain, atypical. This was evaluated last year with a stress echocardiogram which showed no evidence of ischemia. No further workup is indicated at this time. She is on appropriate risk reduction medications. She takes aspirin 81 mg daily and a statin drug at low dose. She does desire to discontinue pravastatin. I do not have a copy of her lipids. I understand that she is having some side effects and certainly the review of the pros and cons in her particular circumstances would be reasonable. We discussed consideration of a statin holiday. She sees Dr. Sherwood Gambler back next month and will discuss this with him.  Hypertension. Blood pressure sounds like it is in the low-normal range much of the time. She's taking labetalol and valsartan. I think it would be reasonable for her to wean off of labetalol and continue with valsartan alone. Again, she will discuss this with Dr. Sherwood Gambler when she sees him next month. She does complain of generalized fatigue and wonders if the labetalol is contributing.  Palpitations. These have improved with use of a long-acting benzodiazepine. However, she continues to be bothered by palpitations. Will order a 48 hour Holter monitor.  Tonny Bollman 12/19/2012 11:42 AM

## 2012-12-27 ENCOUNTER — Encounter: Payer: Self-pay | Admitting: *Deleted

## 2012-12-27 ENCOUNTER — Encounter (INDEPENDENT_AMBULATORY_CARE_PROVIDER_SITE_OTHER): Payer: 59

## 2012-12-27 DIAGNOSIS — R079 Chest pain, unspecified: Secondary | ICD-10-CM

## 2012-12-27 DIAGNOSIS — R002 Palpitations: Secondary | ICD-10-CM

## 2012-12-27 NOTE — Progress Notes (Signed)
Patient ID: [name], female   DOB: 1950/09/27, 62 y.o.   MRN: [id number] E-Cardio 48 Hour Holter Monitor applied to patient.

## 2013-03-09 ENCOUNTER — Other Ambulatory Visit: Payer: Self-pay

## 2013-04-10 ENCOUNTER — Inpatient Hospital Stay: Admission: RE | Admit: 2013-04-10 | Payer: 59 | Source: Ambulatory Visit

## 2013-04-18 ENCOUNTER — Other Ambulatory Visit (HOSPITAL_COMMUNITY): Payer: Self-pay | Admitting: Family Medicine

## 2013-04-18 DIAGNOSIS — Z1231 Encounter for screening mammogram for malignant neoplasm of breast: Secondary | ICD-10-CM

## 2013-05-18 ENCOUNTER — Ambulatory Visit (HOSPITAL_COMMUNITY)
Admission: RE | Admit: 2013-05-18 | Discharge: 2013-05-18 | Disposition: A | Discharge status: Home / Self Care | Payer: 59 | Source: Ambulatory Visit | Attending: Family Medicine | Admitting: Family Medicine

## 2013-05-18 DIAGNOSIS — Z1231 Encounter for screening mammogram for malignant neoplasm of breast: Secondary | ICD-10-CM | POA: Insufficient documentation

## 2013-11-01 ENCOUNTER — Emergency Department (HOSPITAL_COMMUNITY): Payer: 59

## 2013-11-01 ENCOUNTER — Emergency Department (HOSPITAL_COMMUNITY)
Admission: EM | Admit: 2013-11-01 | Discharge: 2013-11-01 | Disposition: A | Discharge status: Home / Self Care | Payer: 59 | Attending: Emergency Medicine | Admitting: Emergency Medicine

## 2013-11-01 ENCOUNTER — Encounter (HOSPITAL_COMMUNITY): Payer: Self-pay | Admitting: Emergency Medicine

## 2013-11-01 DIAGNOSIS — Z79899 Other long term (current) drug therapy: Secondary | ICD-10-CM | POA: Insufficient documentation

## 2013-11-01 DIAGNOSIS — S62101A Fracture of unspecified carpal bone, right wrist, initial encounter for closed fracture: Secondary | ICD-10-CM

## 2013-11-01 DIAGNOSIS — Z87891 Personal history of nicotine dependence: Secondary | ICD-10-CM | POA: Insufficient documentation

## 2013-11-01 DIAGNOSIS — Y9289 Other specified places as the place of occurrence of the external cause: Secondary | ICD-10-CM | POA: Insufficient documentation

## 2013-11-01 DIAGNOSIS — Z9089 Acquired absence of other organs: Secondary | ICD-10-CM | POA: Insufficient documentation

## 2013-11-01 DIAGNOSIS — Z8742 Personal history of other diseases of the female genital tract: Secondary | ICD-10-CM | POA: Insufficient documentation

## 2013-11-01 DIAGNOSIS — W010XXA Fall on same level from slipping, tripping and stumbling without subsequent striking against object, initial encounter: Secondary | ICD-10-CM | POA: Insufficient documentation

## 2013-11-01 DIAGNOSIS — W1809XA Striking against other object with subsequent fall, initial encounter: Secondary | ICD-10-CM | POA: Insufficient documentation

## 2013-11-01 DIAGNOSIS — S62109A Fracture of unspecified carpal bone, unspecified wrist, initial encounter for closed fracture: Secondary | ICD-10-CM | POA: Insufficient documentation

## 2013-11-01 DIAGNOSIS — I10 Essential (primary) hypertension: Secondary | ICD-10-CM | POA: Insufficient documentation

## 2013-11-01 DIAGNOSIS — E7800 Pure hypercholesterolemia, unspecified: Secondary | ICD-10-CM | POA: Insufficient documentation

## 2013-11-01 DIAGNOSIS — Y9389 Activity, other specified: Secondary | ICD-10-CM | POA: Insufficient documentation

## 2013-11-01 MED ORDER — FENTANYL CITRATE 0.05 MG/ML IJ SOLN
50.0000 ug | Freq: Once | INTRAMUSCULAR | Status: AC
  Administered 2013-11-01: 50 ug via INTRAVENOUS
  Filled 2013-11-01: qty 2

## 2013-11-01 MED ORDER — MORPHINE SULFATE 4 MG/ML IJ SOLN
6.0000 mg | Freq: Once | INTRAMUSCULAR | Status: AC
  Administered 2013-11-01: 6 mg via INTRAVENOUS
  Filled 2013-11-01: qty 2

## 2013-11-01 MED ORDER — ONDANSETRON HCL 4 MG/2ML IJ SOLN
4.0000 mg | Freq: Once | INTRAMUSCULAR | Status: AC
  Administered 2013-11-01: 4 mg via INTRAVENOUS
  Filled 2013-11-01: qty 2

## 2013-11-01 MED ORDER — HYDROCODONE-ACETAMINOPHEN 5-325 MG PO TABS: 1.0000 | ORAL_TABLET | ORAL | Status: DC | PRN

## 2013-11-01 NOTE — ED Notes (Signed)
Ortho tech paged for splint.

## 2013-11-01 NOTE — ED Notes (Signed)
MD at bedside. 

## 2013-11-01 NOTE — ED Notes (Signed)
Pt tripped and fell catching herself with her right wrist , pt is c/o right wrist pain , pt received 2 morphine and 4 zofran by EMS , pt has good radial pulses

## 2013-11-01 NOTE — Discharge Instructions (Signed)
Follow up with hand surgery in 1 week   Cast or Splint Care Casts and splints support injured limbs and keep bones from moving while they heal. It is important to care for your cast or splint at home.  HOME CARE INSTRUCTIONS  Keep the cast or splint uncovered during the drying period. It can take 24 to 48 hours to dry if it is made of plaster. A fiberglass cast will dry in less than 1 hour.  Do not rest the cast on anything harder than a pillow for the first 24 hours.  Do not put weight on your injured limb or apply pressure to the cast until your health care provider gives you permission.  Keep the cast or splint dry. Wet casts or splints can lose their shape and may not support the limb as well. A wet cast that has lost its shape can also create harmful pressure on your skin when it dries. Also, wet skin can become infected.  Cover the cast or splint with a plastic bag when bathing or when out in the rain or snow. If the cast is on the trunk of the body, take sponge baths until the cast is removed.  If your cast does become wet, dry it with a towel or a blow dryer on the cool setting only.  Keep your cast or splint clean. Soiled casts may be wiped with a moistened cloth.  Do not place any hard or soft foreign objects under your cast or splint, such as cotton, toilet paper, lotion, or powder.  Do not try to scratch the skin under the cast with any object. The object could get stuck inside the cast. Also, scratching could lead to an infection. If itching is a problem, use a blow dryer on a cool setting to relieve discomfort.  Do not trim or cut your cast or remove padding from inside of it.  Exercise all joints next to the injury that are not immobilized by the cast or splint. For example, if you have a long leg cast, exercise the hip joint and toes. If you have an arm cast or splint, exercise the shoulder, elbow, thumb, and fingers.  Elevate your injured arm or leg on 1 or 2 pillows for  the first 1 to 3 days to decrease swelling and pain.It is best if you can comfortably elevate your cast so it is higher than your heart. SEEK MEDICAL CARE IF:   Your cast or splint cracks.  Your cast or splint is too tight or too loose.  You have unbearable itching inside the cast.  Your cast becomes wet or develops a soft spot or area.  You have a bad smell coming from inside your cast.  You get an object stuck under your cast.  Your skin around the cast becomes red or raw.  You have new pain or worsening pain after the cast has been applied. SEEK IMMEDIATE MEDICAL CARE IF:   You have fluid leaking through the cast.  You are unable to move your fingers or toes.  You have discolored (blue or white), cool, painful, or very swollen fingers or toes beyond the cast.  You have tingling or numbness around the injured area.  You have severe pain or pressure under the cast.  You have any difficulty with your breathing or have shortness of breath.  You have chest pain. Document Released: 04/17/2000 Document Revised: 02/08/2013 Document Reviewed: 10/27/2012 [facility] Patient Information 2015 [place], [place]. This information is not intended  to replace advice given to you by your health care provider. Make sure you discuss any questions you have with your health care provider. ° °

## 2013-11-02 ENCOUNTER — Emergency Department (HOSPITAL_COMMUNITY)
Admission: EM | Admit: 2013-11-02 | Discharge: 2013-11-02 | Disposition: A | Discharge status: Home / Self Care | Payer: 59 | Attending: Emergency Medicine | Admitting: Emergency Medicine

## 2013-11-02 ENCOUNTER — Encounter (HOSPITAL_COMMUNITY): Payer: Self-pay | Admitting: Emergency Medicine

## 2013-11-02 DIAGNOSIS — I10 Essential (primary) hypertension: Secondary | ICD-10-CM | POA: Insufficient documentation

## 2013-11-02 DIAGNOSIS — Z90710 Acquired absence of both cervix and uterus: Secondary | ICD-10-CM | POA: Insufficient documentation

## 2013-11-02 DIAGNOSIS — S5290XD Unspecified fracture of unspecified forearm, subsequent encounter for closed fracture with routine healing: Secondary | ICD-10-CM | POA: Insufficient documentation

## 2013-11-02 DIAGNOSIS — Z87891 Personal history of nicotine dependence: Secondary | ICD-10-CM | POA: Insufficient documentation

## 2013-11-02 DIAGNOSIS — S52511S Displaced fracture of right radial styloid process, sequela: Secondary | ICD-10-CM

## 2013-11-02 DIAGNOSIS — Z8742 Personal history of other diseases of the female genital tract: Secondary | ICD-10-CM | POA: Insufficient documentation

## 2013-11-02 DIAGNOSIS — Z9089 Acquired absence of other organs: Secondary | ICD-10-CM | POA: Insufficient documentation

## 2013-11-02 DIAGNOSIS — Z79899 Other long term (current) drug therapy: Secondary | ICD-10-CM | POA: Insufficient documentation

## 2013-11-02 DIAGNOSIS — E785 Hyperlipidemia, unspecified: Secondary | ICD-10-CM | POA: Insufficient documentation

## 2013-11-02 MED ORDER — HYDROCODONE-ACETAMINOPHEN 5-325 MG PO TABS: 1.0000 | ORAL_TABLET | ORAL | Status: DC | PRN

## 2013-11-02 MED ORDER — HYDROCODONE-ACETAMINOPHEN 5-325 MG PO TABS
1.0000 | ORAL_TABLET | Freq: Once | ORAL | Status: AC
  Administered 2013-11-02: 1 via ORAL
  Filled 2013-11-02: qty 1

## 2013-11-02 NOTE — ED Notes (Signed)
Pt reports being here last night for right arm injury. Reports cast is uncomfortable and having swelling to fingers.

## 2013-11-02 NOTE — ED Notes (Signed)
MD at bedside. 

## 2013-11-02 NOTE — ED Provider Notes (Signed)
CSN: [id number]     Arrival date & time 11/01/13  1739 History   First MD Initiated Contact with Patient 11/01/13 1744     Chief Complaint  Patient presents with  . Wrist Pain     (Consider location/radiation/quality/duration/timing/severity/associated sxs/prior Treatment) Patient is a 63 y.o. female presenting with wrist pain.  Wrist Pain This is a new problem. The current episode started today. The problem occurs constantly. The problem has been unchanged. Pertinent negatives include no abdominal pain, chest pain, congestion, coughing, headaches, nausea or vomiting. The symptoms are aggravated by exertion. She has tried nothing for the symptoms.   63 y/o female that presents after a mechanical fall in which he landed on an out stretched wrist. Patient states the she hit her foot on a step causing her to fall. She remembers hitting her chin although has not pain in the area now. Patient endorses wrist pain and neck pain, that she states is chronic in nature and unchanged. The patient denies LOC, and states that she did not feel lighe headed or weak prior to the fall.   Past Medical History  Diagnosis Date  . Hypertension   . Hyperlipidemia   . Renal insufficiency   . History of hysterectomy   . Hx of cholecystectomy   . Hypercholesterolemia    Past Surgical History  Procedure Laterality Date  . Cesarean section    . Abdominal hysterectomy     History reviewed. No pertinent family history. History  Substance Use Topics  . Smoking status: Former Research scientist (life sciences)  . Smokeless tobacco: Not on file  . Alcohol Use: No   OB History   Grav Para Term Preterm Abortions TAB SAB Ect Mult Living                 Review of Systems  Constitutional: Negative for activity change.  HENT: Negative for congestion.   Respiratory: Negative for cough and shortness of breath.   Cardiovascular: Negative for chest pain and leg swelling.  Gastrointestinal: Negative for nausea, vomiting, abdominal pain,  diarrhea, constipation, blood in stool and abdominal distention.  Genitourinary: Negative for dysuria, flank pain and vaginal discharge.  Musculoskeletal: Negative for back pain.       Wrist pain   Skin: Negative for color change.  Neurological: Negative for syncope and headaches.  Psychiatric/Behavioral: Negative for agitation.      Allergies  Ace inhibitors; Norvasc; Nsaids; and Omnipaque  Home Medications   Prior to Admission medications   Medication Sig Start Date End Date Taking? Authorizing Provider  Cinnamon 500 MG TABS Take 500 mg by mouth 2 (two) times daily.   Yes Historical Provider, MD  clonazePAM (KLONOPIN) 0.5 MG tablet Take 0.5 mg by mouth at bedtime.    Yes Historical Provider, MD  Coenzyme Q10 (COQ10) 100 MG CAPS Take by mouth daily.   Yes Historical Provider, MD  fish oil-omega-3 fatty acids 1000 MG capsule Take 2 g by mouth daily.    Yes Historical Provider, MD  KRILL OIL PO Take 1 tablet by mouth daily.   Yes Historical Provider, MD  Multiple Vitamins-Minerals (MULTIVITAMIN WITH MINERALS) tablet Take 1 tablet by mouth daily.     Yes Historical Provider, MD  sertraline (ZOLOFT) 100 MG tablet Take 100 mg by mouth daily.  11/21/12  Yes Historical Provider, MD  valsartan (DIOVAN) 80 MG tablet Take 80 mg by mouth daily.     Yes Historical Provider, MD  ezetimibe (ZETIA) 10 MG tablet Take 10 mg by mouth  daily.    Historical Provider, MD  HYDROcodone-acetaminophen (NORCO/VICODIN) 5-325 MG per tablet Take 1 tablet by mouth every 4 (four) hours as needed for moderate pain or severe pain. 11/01/13   Claudean Severance, MD  HYDROcodone-acetaminophen (NORCO/VICODIN) 5-325 MG per tablet Take 1-2 tablets by mouth every 4 (four) hours as needed for moderate pain or severe pain. 11/02/13   Kaitlyn Szekalski, PA-C   BP 109/60  Pulse 69  Temp(Src) 99.1 F (37.3 C) (Oral)  Resp 12  SpO2 98% Physical Exam  Constitutional: She is oriented to person, place, and time. She appears  well-developed.  HENT:  Head: Normocephalic.  No trauma noted on chin or face  All teeth were palpated and not loose   Eyes: Pupils are equal, round, and reactive to light.  Neck: Neck supple.  Pain along midline   Cardiovascular: Normal rate.  Exam reveals no gallop and no friction rub.   No murmur heard. Pulmonary/Chest: Effort normal and breath sounds normal. No respiratory distress.  Abdominal: Soft. She exhibits no distension. There is no tenderness. There is no rebound.  Musculoskeletal: She exhibits no edema.  Right wrist  - noted swelling of the wrist  - pulse motor sensation intact    Neurological: She is alert and oriented to person, place, and time. No cranial nerve deficit. Coordination normal.  Skin: Skin is warm.  Psychiatric: She has a normal mood and affect.    ED Course  Procedures (including critical care time) Labs Review Labs Reviewed - No data to display  Imaging Review Dg Wrist Complete Right  11/01/2013   CLINICAL DATA:  Posterior right wrist pain after fall  EXAM: RIGHT WRIST - COMPLETE 3+ VIEW  COMPARISON:  None.  FINDINGS: Nondisplaced radial styloid fracture with probable intra-articular extension, although poorly visualized.  Overlying soft tissue swelling.  IMPRESSION: Nondisplaced radial styloid fracture.   Electronically Signed   By: Julian Hy M.D.   On: 11/01/2013 19:09   Ct Cervical Spine Wo Contrast  11/01/2013   CLINICAL DATA:  Status post fall.  EXAM: CT CERVICAL SPINE WITHOUT CONTRAST  TECHNIQUE: Multidetector CT imaging of the cervical spine was performed without intravenous contrast. Multiplanar CT image reconstructions were also generated.  COMPARISON:  CT scan of the neck of 2009.  FINDINGS: The cervical vertebral bodies are preserved in height. There is degenerative disc space narrowing at see 5 6 which is stable. There did stable degenerative changes of the atlanto dens articulation. The prevertebral soft tissue spaces are normal. There  is no perched facet. The pulmonary apices are clear. There is a stable 1 cm hypodense nodule in the left thyroid lobe.  IMPRESSION: There is no acute cervical spine fracture nor dislocation. There are stable mild degenerative changes as described.   Electronically Signed   By: David  Martinique   On: 11/01/2013 18:46     EKG Interpretation None      MDM   63 y/o female that sustained a mechanical fall hitting her right wrist and chin. On physical exam there was tenderness of the midline c spine noted as chronic and pulse motor sensation intact of the right wrist and hand.   Patient evaluated with XR of wrist and CT C spine. It was noted that she did not sustain a C spine fracture. A fracture of the radial styloid was noted on XR imaging.   Patient was placed in a sugar tong splint and instructed to follow-up with the hand surgeon on call in 1 week.  She was given Norco for pain control and then discharged home.   Final diagnoses:  Right wrist fracture, closed, initial encounter       Claudean Severance, MD 11/03/13 0008

## 2013-11-02 NOTE — ED Notes (Signed)
Refused wheelchair 

## 2013-11-02 NOTE — ED Notes (Signed)
Lab results reported to Dr. Goldston. 

## 2013-11-02 NOTE — Progress Notes (Signed)
Orthopedic Tech Progress Note Patient Details:  [name] 1950-06-14 [id number]  Ortho Devices Type of Ortho Device: Ace wrap;Volar splint Ortho Device/Splint Location: RUE Ortho Device/Splint Interventions: Ordered;Application   Braulio Bosch 11/02/2013, 5:59 PM

## 2013-11-02 NOTE — ED Provider Notes (Signed)
CSN: [id number]     Arrival date & time 11/02/13  1630 History   First MD Initiated Contact with Patient 11/02/13 1639     Chief Complaint  Patient presents with  . Arm Injury     (Consider location/radiation/quality/duration/timing/severity/associated sxs/prior Treatment) HPI Comments: Patient is a 63 year old female who presents with splint discomfort of right arm since last night. Patient has a right radial styloid fracture that occurred from a fall that happened yesterday. Patient has a splint placed last night and reports discomfort since the splint was applied. The pain is throbbing and severe. She gets no relief with taking vicodin at home.She reports associated swelling to her fingers of right hand and right elbow. No aggravating/alleviating factors. No other associated symptoms.    Past Medical History  Diagnosis Date  . Hypertension   . Hyperlipidemia   . Renal insufficiency   . History of hysterectomy   . Hx of cholecystectomy   . Hypercholesterolemia    Past Surgical History  Procedure Laterality Date  . Cesarean section    . Abdominal hysterectomy     History reviewed. No pertinent family history. History  Substance Use Topics  . Smoking status: Former Research scientist (life sciences)  . Smokeless tobacco: Not on file  . Alcohol Use: No   OB History   Grav Para Term Preterm Abortions TAB SAB Ect Mult Living                 Review of Systems  Constitutional: Negative for fever, chills and fatigue.  HENT: Negative for trouble swallowing.   Eyes: Negative for visual disturbance.  Respiratory: Negative for shortness of breath.   Cardiovascular: Negative for chest pain and palpitations.  Gastrointestinal: Negative for nausea, vomiting, abdominal pain and diarrhea.  Genitourinary: Negative for dysuria and difficulty urinating.  Musculoskeletal: Positive for arthralgias. Negative for neck pain.  Skin: Negative for color change.  Neurological: Negative for dizziness and weakness.   Psychiatric/Behavioral: Negative for dysphoric mood.      Allergies  Ace inhibitors; Norvasc; Nsaids; and Omnipaque  Home Medications   Prior to Admission medications   Medication Sig Start Date End Date Taking? Authorizing Provider  Cinnamon 500 MG TABS Take 500 mg by mouth 2 (two) times daily.   Yes Historical Provider, MD  clonazePAM (KLONOPIN) 0.5 MG tablet Take 0.5 mg by mouth at bedtime.    Yes Historical Provider, MD  Coenzyme Q10 (COQ10) 100 MG CAPS Take by mouth daily.   Yes Historical Provider, MD  ezetimibe (ZETIA) 10 MG tablet Take 10 mg by mouth daily.   Yes Historical Provider, MD  fish oil-omega-3 fatty acids 1000 MG capsule Take 2 g by mouth daily.    Yes Historical Provider, MD  HYDROcodone-acetaminophen (NORCO/VICODIN) 5-325 MG per tablet Take 1 tablet by mouth every 4 (four) hours as needed for moderate pain or severe pain. 11/01/13  Yes Claudean Severance, MD  KRILL OIL PO Take 1 tablet by mouth daily.   Yes Historical Provider, MD  Multiple Vitamins-Minerals (MULTIVITAMIN WITH MINERALS) tablet Take 1 tablet by mouth daily.     Yes Historical Provider, MD  sertraline (ZOLOFT) 100 MG tablet Take 100 mg by mouth daily.  11/21/12  Yes Historical Provider, MD  valsartan (DIOVAN) 80 MG tablet Take 80 mg by mouth daily.     Yes Historical Provider, MD   BP 134/84  Pulse 81  Temp(Src) 97.8 F (36.6 C) (Oral)  Resp 18  SpO2 98% Physical Exam  Nursing note and  vitals reviewed. Constitutional: She is oriented to person, place, and time. She appears well-developed and well-nourished. No distress.  HENT:  Head: Normocephalic and atraumatic.  Eyes: Conjunctivae and EOM are normal.  Neck: Normal range of motion.  Cardiovascular: Normal rate and regular rhythm.  Exam reveals no gallop and no friction rub.   No murmur heard. Pulmonary/Chest: Effort normal and breath sounds normal. She has no wheezes. She has no rales. She exhibits no tenderness.  Musculoskeletal:  Patient has a  sugar tong splint intact of right arm that extends proximally of her right elbow. Swelling and limited ROM of fingers of right hand. Limited mobility of right arm due to splint discomfort.   Neurological: She is alert and oriented to person, place, and time. Coordination normal.  Speech is goal-oriented. Moves limbs without ataxia.   Skin: Skin is warm and dry.  Psychiatric: She has a normal mood and affect. Her behavior is normal.    ED Course  Procedures (including critical care time) Labs Review Labs Reviewed - No data to display  SPLINT APPLICATION Date/Time: 9:62 PM Authorized by: Alvina Chou Consent: Verbal consent obtained. Risks and benefits: risks, benefits and alternatives were discussed Consent given by: patient Splint applied by: orthopedic technician Location details: right wrist Splint type: volar splint Post-procedure: The splinted body part was neurovascularly unchanged following the procedure. Patient tolerance: Patient tolerated the procedure well with no immediate complications.     Imaging Review Dg Wrist Complete Right  11/01/2013   CLINICAL DATA:  Posterior right wrist pain after fall  EXAM: RIGHT WRIST - COMPLETE 3+ VIEW  COMPARISON:  None.  FINDINGS: Nondisplaced radial styloid fracture with probable intra-articular extension, although poorly visualized.  Overlying soft tissue swelling.  IMPRESSION: Nondisplaced radial styloid fracture.   Electronically Signed   By: Julian Hy M.D.   On: 11/01/2013 19:09   Ct Cervical Spine Wo Contrast  11/01/2013   CLINICAL DATA:  Status post fall.  EXAM: CT CERVICAL SPINE WITHOUT CONTRAST  TECHNIQUE: Multidetector CT imaging of the cervical spine was performed without intravenous contrast. Multiplanar CT image reconstructions were also generated.  COMPARISON:  CT scan of the neck of 2009.  FINDINGS: The cervical vertebral bodies are preserved in height. There is degenerative disc space narrowing at see 5 6 which is  stable. There did stable degenerative changes of the atlanto dens articulation. The prevertebral soft tissue spaces are normal. There is no perched facet. The pulmonary apices are clear. There is a stable 1 cm hypodense nodule in the left thyroid lobe.  IMPRESSION: There is no acute cervical spine fracture nor dislocation. There are stable mild degenerative changes as described.   Electronically Signed   By: David  Martinique   On: 11/01/2013 18:46     EKG Interpretation None      MDM   Final diagnoses:  Radial styloid fracture, right, sequela    5:58 PM Patient's splint will be replaced with a more comfortable splint. Patient will have vicodin for pain.   6:11 PM Splint replaced and patient is feeling better. Patient will have refill of pain medication here. She has an appointment with hand surgery next week for follow. No neurovascular compromise.     Alvina Chou, PA-C 11/02/13 1821

## 2013-11-03 NOTE — ED Provider Notes (Signed)
I saw and evaluated the patient, reviewed the resident's note and I agree with the findings and plan.   EKG Interpretation None       the patient with fall. wrist fracture and also chin. Chronic neck pain. CT reassuring. Nonfocal neuro exam. Discharge home   Jasper Riling. Alvino Chapel, MD 11/03/13 [phone]

## 2013-11-03 NOTE — ED Provider Notes (Signed)
Medical screening examination/treatment/procedure(s) were performed by non-physician practitioner and as supervising physician I was immediately available for consultation/collaboration.   EKG Interpretation None        Ephraim Hamburger, MD 11/03/13 1332

## 2014-07-24 ENCOUNTER — Other Ambulatory Visit (HOSPITAL_COMMUNITY): Payer: Self-pay | Admitting: Internal Medicine

## 2014-07-24 DIAGNOSIS — Z1231 Encounter for screening mammogram for malignant neoplasm of breast: Secondary | ICD-10-CM

## 2014-07-25 ENCOUNTER — Ambulatory Visit (HOSPITAL_COMMUNITY)
Admission: RE | Admit: 2014-07-25 | Discharge: 2014-07-25 | Disposition: A | Discharge status: Home / Self Care | Payer: 59 | Source: Ambulatory Visit | Attending: Internal Medicine | Admitting: Internal Medicine

## 2014-07-25 DIAGNOSIS — Z1231 Encounter for screening mammogram for malignant neoplasm of breast: Secondary | ICD-10-CM | POA: Diagnosis not present

## 2014-10-09 ENCOUNTER — Emergency Department (HOSPITAL_COMMUNITY)
Admission: EM | Admit: 2014-10-09 | Discharge: 2014-10-09 | Disposition: A | Discharge status: Home / Self Care | Payer: 59 | Attending: Emergency Medicine | Admitting: Emergency Medicine

## 2014-10-09 ENCOUNTER — Emergency Department (HOSPITAL_COMMUNITY): Payer: 59

## 2014-10-09 ENCOUNTER — Encounter (HOSPITAL_COMMUNITY): Payer: Self-pay | Admitting: *Deleted

## 2014-10-09 DIAGNOSIS — E785 Hyperlipidemia, unspecified: Secondary | ICD-10-CM | POA: Insufficient documentation

## 2014-10-09 DIAGNOSIS — Z87891 Personal history of nicotine dependence: Secondary | ICD-10-CM | POA: Insufficient documentation

## 2014-10-09 DIAGNOSIS — E780 Pure hypercholesterolemia: Secondary | ICD-10-CM | POA: Diagnosis not present

## 2014-10-09 DIAGNOSIS — Z90710 Acquired absence of both cervix and uterus: Secondary | ICD-10-CM | POA: Insufficient documentation

## 2014-10-09 DIAGNOSIS — Z9889 Other specified postprocedural states: Secondary | ICD-10-CM | POA: Insufficient documentation

## 2014-10-09 DIAGNOSIS — R0600 Dyspnea, unspecified: Secondary | ICD-10-CM

## 2014-10-09 DIAGNOSIS — Z79899 Other long term (current) drug therapy: Secondary | ICD-10-CM | POA: Insufficient documentation

## 2014-10-09 DIAGNOSIS — Z87448 Personal history of other diseases of urinary system: Secondary | ICD-10-CM | POA: Diagnosis not present

## 2014-10-09 DIAGNOSIS — I10 Essential (primary) hypertension: Secondary | ICD-10-CM | POA: Insufficient documentation

## 2014-10-09 DIAGNOSIS — R0602 Shortness of breath: Secondary | ICD-10-CM | POA: Diagnosis present

## 2014-10-09 LAB — BASIC METABOLIC PANEL
Anion gap: 6 (ref 5–15)
BUN: 19 mg/dL (ref 6–20)
CO2: 26 mmol/L (ref 22–32)
Calcium: 9.6 mg/dL (ref 8.9–10.3)
Chloride: 108 mmol/L (ref 101–111)
Creatinine, Ser: 0.96 mg/dL (ref 0.44–1.00)
GFR calc Af Amer: >60 mL/min (ref >60)
GFR calc non Af Amer: >60 mL/min (ref >60)
Glucose, Bld: 118 mg/dL — ABNORMAL HIGH (ref 65–99)
Potassium: 5.2 mmol/L — ABNORMAL HIGH (ref 3.5–5.1)
Sodium: 140 mmol/L (ref 135–145)

## 2014-10-09 LAB — CBC
HCT: 40.6 % (ref 36.0–46.0)
Hemoglobin: 13.4 g/dL (ref 12.0–15.0)
MCH: 29 pg (ref 26.0–34.0)
MCHC: 33 g/dL (ref 30.0–36.0)
MCV: 87.9 fL (ref 78.0–100.0)
PLATELETS: 256 10*3/uL (ref 150–400)
RBC: 4.62 MIL/uL (ref 3.87–5.11)
RDW: 13.4 % (ref 11.5–15.5)
WBC: 9.5 10*3/uL (ref 4.0–10.5)

## 2014-10-09 LAB — BRAIN NATRIURETIC PEPTIDE: B Natriuretic Peptide: 60.5 pg/mL (ref 0.0–100.0)

## 2014-10-09 LAB — I-STAT TROPONIN, ED: Troponin i, poc: 0 ng/mL (ref 0.00–0.08)

## 2014-10-09 NOTE — Discharge Instructions (Signed)

## 2014-10-09 NOTE — ED Provider Notes (Signed)
CSN: [id number]     Arrival date & time 10/09/14  1849 History   First MD Initiated Contact with Patient 10/09/14 2248     Chief Complaint  Patient presents with  . Shortness of Breath     (Consider location/radiation/quality/duration/timing/severity/associated sxs/prior Treatment) Patient is a 64 y.o. female presenting with shortness of breath.  Shortness of Breath Severity:  Moderate Onset quality:  Gradual Duration:  2 weeks Timing:  Intermittent Progression:  Unchanged Chronicity:  New Context: not URI   Relieved by:  Nothing Worsened by:  Nothing tried Ineffective treatments:  None tried Associated symptoms: no abdominal pain, no cough, no vomiting and no wheezing     Past Medical History  Diagnosis Date  . Hypertension   . Hyperlipidemia   . Renal insufficiency   . History of hysterectomy   . Hx of cholecystectomy   . Hypercholesterolemia    Past Surgical History  Procedure Laterality Date  . Cesarean section    . Abdominal hysterectomy     History reviewed. No pertinent family history. History  Substance Use Topics  . Smoking status: Former Research scientist (life sciences)  . Smokeless tobacco: Not on file  . Alcohol Use: No   OB History    No data available     Review of Systems  Respiratory: Positive for shortness of breath. Negative for cough and wheezing.   Gastrointestinal: Negative for vomiting and abdominal pain.  All other systems reviewed and are negative.     Allergies  Ace inhibitors; Norvasc; Nsaids; and Omnipaque  Home Medications   Prior to Admission medications   Medication Sig Start Date End Date Taking? Authorizing Provider  Cinnamon 500 MG TABS Take 500 mg by mouth 2 (two) times daily.    Historical Provider, MD  clonazePAM (KLONOPIN) 0.5 MG tablet Take 0.5 mg by mouth at bedtime.     Historical Provider, MD  Coenzyme Q10 (COQ10) 100 MG CAPS Take by mouth daily.    Historical Provider, MD  ezetimibe (ZETIA) 10 MG tablet Take 10 mg by mouth daily.     Historical Provider, MD  fish oil-omega-3 fatty acids 1000 MG capsule Take 2 g by mouth daily.     Historical Provider, MD  HYDROcodone-acetaminophen (NORCO/VICODIN) 5-325 MG per tablet Take 1 tablet by mouth every 4 (four) hours as needed for moderate pain or severe pain. 11/01/13   Claudean Severance, MD  HYDROcodone-acetaminophen (NORCO/VICODIN) 5-325 MG per tablet Take 1-2 tablets by mouth every 4 (four) hours as needed for moderate pain or severe pain. 11/02/13   Kaitlyn Szekalski, PA-C  KRILL OIL PO Take 1 tablet by mouth daily.    Historical Provider, MD  Multiple Vitamins-Minerals (MULTIVITAMIN WITH MINERALS) tablet Take 1 tablet by mouth daily.      Historical Provider, MD  sertraline (ZOLOFT) 100 MG tablet Take 100 mg by mouth daily.  11/21/12   Historical Provider, MD  valsartan (DIOVAN) 80 MG tablet Take 80 mg by mouth daily.      Historical Provider, MD   BP 132/67 mmHg  Pulse 76  Temp(Src) 98.5 F (36.9 C) (Oral)  Resp 16  Wt 197 lb (89.359 kg)  SpO2 100% Physical Exam  Constitutional: She is oriented to person, place, and time. She appears well-developed and well-nourished.  HENT:  Head: Normocephalic and atraumatic.  Right Ear: External ear normal.  Left Ear: External ear normal.  Eyes: Conjunctivae and EOM are normal. Pupils are equal, round, and reactive to light.  Neck: Normal range of motion. Neck  supple.  Cardiovascular: Normal rate, regular rhythm, normal heart sounds and intact distal pulses.   Pulmonary/Chest: Effort normal and breath sounds normal.  Abdominal: Soft. Bowel sounds are normal. There is no tenderness.  Musculoskeletal: Normal range of motion.  Neurological: She is alert and oriented to person, place, and time.  Skin: Skin is warm and dry.  Vitals reviewed.   ED Course  Procedures (including critical care time) Labs Review Labs Reviewed  BASIC METABOLIC PANEL - Abnormal; Notable for the following:    Potassium 5.2 (*)    Glucose, Bld 118 (*)    All  other components within normal limits  CBC  BRAIN NATRIURETIC PEPTIDE  I-STAT TROPOININ, ED    Imaging Review Dg Chest 2 View  10/09/2014   CLINICAL DATA:  Cough and shortness of breath for 1 week.  EXAM: CHEST  2 VIEW  COMPARISON:  03/23/2012  FINDINGS: Lungs are clear. Heart and mediastinum are within normal limits. The trachea is midline. No large pleural effusions. Mild degenerative changes in the thoracic spine.  IMPRESSION: No active cardiopulmonary disease.   Electronically Signed   By: Markus Daft M.D.   On: 10/09/2014 20:12     EKG Interpretation   Date/Time:  Tuesday October 09 2014 18:54:29 EDT Ventricular Rate:  95 PR Interval:  148 QRS Duration: 76 QT Interval:  356 QTC Calculation: 447 R Axis:   66 Text Interpretation:  Normal sinus rhythm Normal ECG No significant change  since last tracing Confirmed by Debby Freiberg [phone]) on 10/09/2014  10:51:24 PM      MDM   Final diagnoses:  Dyspnea    64 y.o. female with pertinent PMH of HTN, HLD presents with 2 weeks of intermittent dyspnea as above.  No chest pain, gi symptoms.  Pt primarily concerned about possibility of thyroid compression of her airway, however historically would not expect intermittent symptoms, and no stridor on exam today.  Physical exam completely benign.  No new symptoms in 6 hours.  Cardiac wu similarly unremarkable.  DC home in stable condition.    I have reviewed all laboratory and imaging studies if ordered as above  1. Dyspnea         Debby Freiberg, MD 10/09/14 2303

## 2014-10-09 NOTE — ED Notes (Signed)
Pt in c/o shortness of breath with exertion for the last week, bilateral foot swelling, no distress noted, denies pain

## 2015-08-27 DIAGNOSIS — G4700 Insomnia, unspecified: Secondary | ICD-10-CM | POA: Diagnosis not present

## 2015-08-27 DIAGNOSIS — E6609 Other obesity due to excess calories: Secondary | ICD-10-CM | POA: Diagnosis not present

## 2015-08-27 DIAGNOSIS — F419 Anxiety disorder, unspecified: Secondary | ICD-10-CM | POA: Diagnosis not present

## 2015-08-27 DIAGNOSIS — R5383 Other fatigue: Secondary | ICD-10-CM | POA: Diagnosis not present

## 2015-08-27 DIAGNOSIS — E785 Hyperlipidemia, unspecified: Secondary | ICD-10-CM | POA: Diagnosis not present

## 2015-08-27 DIAGNOSIS — Z1389 Encounter for screening for other disorder: Secondary | ICD-10-CM | POA: Diagnosis not present

## 2015-08-27 DIAGNOSIS — Z79899 Other long term (current) drug therapy: Secondary | ICD-10-CM | POA: Diagnosis not present

## 2015-08-27 DIAGNOSIS — E781 Pure hyperglyceridemia: Secondary | ICD-10-CM | POA: Diagnosis not present

## 2015-08-27 DIAGNOSIS — I10 Essential (primary) hypertension: Secondary | ICD-10-CM | POA: Diagnosis not present

## 2015-08-27 DIAGNOSIS — Z6831 Body mass index (BMI) 31.0-31.9, adult: Secondary | ICD-10-CM | POA: Diagnosis not present

## 2015-08-27 DIAGNOSIS — F321 Major depressive disorder, single episode, moderate: Secondary | ICD-10-CM | POA: Diagnosis not present

## 2015-08-27 DIAGNOSIS — E782 Mixed hyperlipidemia: Secondary | ICD-10-CM | POA: Diagnosis not present

## 2015-08-27 DIAGNOSIS — E041 Nontoxic single thyroid nodule: Secondary | ICD-10-CM | POA: Diagnosis not present

## 2015-08-27 DIAGNOSIS — F329 Major depressive disorder, single episode, unspecified: Secondary | ICD-10-CM | POA: Diagnosis not present

## 2015-08-29 ENCOUNTER — Other Ambulatory Visit: Payer: Self-pay | Admitting: Internal Medicine

## 2015-08-29 DIAGNOSIS — E049 Nontoxic goiter, unspecified: Secondary | ICD-10-CM

## 2015-08-29 DIAGNOSIS — R7309 Other abnormal glucose: Secondary | ICD-10-CM | POA: Diagnosis not present

## 2015-08-29 DIAGNOSIS — I10 Essential (primary) hypertension: Secondary | ICD-10-CM | POA: Diagnosis not present

## 2015-09-02 ENCOUNTER — Ambulatory Visit
Admission: RE | Admit: 2015-09-02 | Discharge: 2015-09-02 | Disposition: A | Discharge status: Home / Self Care | Payer: Medicare Other | Source: Ambulatory Visit | Attending: Internal Medicine | Admitting: Internal Medicine

## 2015-09-02 DIAGNOSIS — E049 Nontoxic goiter, unspecified: Secondary | ICD-10-CM

## 2015-09-02 DIAGNOSIS — E042 Nontoxic multinodular goiter: Secondary | ICD-10-CM | POA: Diagnosis not present

## 2015-09-12 DIAGNOSIS — M4004 Postural kyphosis, thoracic region: Secondary | ICD-10-CM | POA: Diagnosis not present

## 2015-09-12 DIAGNOSIS — M47816 Spondylosis without myelopathy or radiculopathy, lumbar region: Secondary | ICD-10-CM | POA: Diagnosis not present

## 2015-09-12 DIAGNOSIS — M9903 Segmental and somatic dysfunction of lumbar region: Secondary | ICD-10-CM | POA: Diagnosis not present

## 2015-09-12 DIAGNOSIS — F411 Generalized anxiety disorder: Secondary | ICD-10-CM | POA: Diagnosis not present

## 2015-09-12 DIAGNOSIS — M9902 Segmental and somatic dysfunction of thoracic region: Secondary | ICD-10-CM | POA: Diagnosis not present

## 2015-09-12 DIAGNOSIS — J302 Other seasonal allergic rhinitis: Secondary | ICD-10-CM | POA: Diagnosis not present

## 2015-09-12 DIAGNOSIS — M9901 Segmental and somatic dysfunction of cervical region: Secondary | ICD-10-CM | POA: Diagnosis not present

## 2015-09-12 DIAGNOSIS — Z6831 Body mass index (BMI) 31.0-31.9, adult: Secondary | ICD-10-CM | POA: Diagnosis not present

## 2015-09-12 DIAGNOSIS — Z1389 Encounter for screening for other disorder: Secondary | ICD-10-CM | POA: Diagnosis not present

## 2015-09-12 DIAGNOSIS — M47812 Spondylosis without myelopathy or radiculopathy, cervical region: Secondary | ICD-10-CM | POA: Diagnosis not present

## 2015-09-12 DIAGNOSIS — E6609 Other obesity due to excess calories: Secondary | ICD-10-CM | POA: Diagnosis not present

## 2015-09-16 DIAGNOSIS — G4730 Sleep apnea, unspecified: Secondary | ICD-10-CM | POA: Diagnosis not present

## 2015-09-16 DIAGNOSIS — Z719 Counseling, unspecified: Secondary | ICD-10-CM | POA: Diagnosis not present

## 2015-09-17 DIAGNOSIS — M9902 Segmental and somatic dysfunction of thoracic region: Secondary | ICD-10-CM | POA: Diagnosis not present

## 2015-09-17 DIAGNOSIS — M9903 Segmental and somatic dysfunction of lumbar region: Secondary | ICD-10-CM | POA: Diagnosis not present

## 2015-09-17 DIAGNOSIS — M47816 Spondylosis without myelopathy or radiculopathy, lumbar region: Secondary | ICD-10-CM | POA: Diagnosis not present

## 2015-09-17 DIAGNOSIS — M4004 Postural kyphosis, thoracic region: Secondary | ICD-10-CM | POA: Diagnosis not present

## 2015-09-17 DIAGNOSIS — M47812 Spondylosis without myelopathy or radiculopathy, cervical region: Secondary | ICD-10-CM | POA: Diagnosis not present

## 2015-09-17 DIAGNOSIS — M9901 Segmental and somatic dysfunction of cervical region: Secondary | ICD-10-CM | POA: Diagnosis not present

## 2015-09-20 DIAGNOSIS — M9903 Segmental and somatic dysfunction of lumbar region: Secondary | ICD-10-CM | POA: Diagnosis not present

## 2015-09-20 DIAGNOSIS — M47816 Spondylosis without myelopathy or radiculopathy, lumbar region: Secondary | ICD-10-CM | POA: Diagnosis not present

## 2015-09-20 DIAGNOSIS — M9902 Segmental and somatic dysfunction of thoracic region: Secondary | ICD-10-CM | POA: Diagnosis not present

## 2015-09-20 DIAGNOSIS — M47812 Spondylosis without myelopathy or radiculopathy, cervical region: Secondary | ICD-10-CM | POA: Diagnosis not present

## 2015-09-20 DIAGNOSIS — M4004 Postural kyphosis, thoracic region: Secondary | ICD-10-CM | POA: Diagnosis not present

## 2015-09-20 DIAGNOSIS — M9901 Segmental and somatic dysfunction of cervical region: Secondary | ICD-10-CM | POA: Diagnosis not present

## 2015-09-20 DIAGNOSIS — S338XXA Sprain of other parts of lumbar spine and pelvis, initial encounter: Secondary | ICD-10-CM | POA: Diagnosis not present

## 2015-09-24 DIAGNOSIS — I10 Essential (primary) hypertension: Secondary | ICD-10-CM | POA: Diagnosis not present

## 2015-09-24 DIAGNOSIS — E049 Nontoxic goiter, unspecified: Secondary | ICD-10-CM | POA: Diagnosis not present

## 2015-09-24 DIAGNOSIS — E7800 Pure hypercholesterolemia, unspecified: Secondary | ICD-10-CM | POA: Diagnosis not present

## 2015-09-24 DIAGNOSIS — E1165 Type 2 diabetes mellitus with hyperglycemia: Secondary | ICD-10-CM | POA: Diagnosis not present

## 2015-09-24 DIAGNOSIS — E213 Hyperparathyroidism, unspecified: Secondary | ICD-10-CM | POA: Diagnosis not present

## 2015-09-25 DIAGNOSIS — M9903 Segmental and somatic dysfunction of lumbar region: Secondary | ICD-10-CM | POA: Diagnosis not present

## 2015-09-25 DIAGNOSIS — M47812 Spondylosis without myelopathy or radiculopathy, cervical region: Secondary | ICD-10-CM | POA: Diagnosis not present

## 2015-09-25 DIAGNOSIS — M47816 Spondylosis without myelopathy or radiculopathy, lumbar region: Secondary | ICD-10-CM | POA: Diagnosis not present

## 2015-09-25 DIAGNOSIS — M9901 Segmental and somatic dysfunction of cervical region: Secondary | ICD-10-CM | POA: Diagnosis not present

## 2015-09-25 DIAGNOSIS — S338XXA Sprain of other parts of lumbar spine and pelvis, initial encounter: Secondary | ICD-10-CM | POA: Diagnosis not present

## 2015-09-25 DIAGNOSIS — M9902 Segmental and somatic dysfunction of thoracic region: Secondary | ICD-10-CM | POA: Diagnosis not present

## 2015-09-25 DIAGNOSIS — M4004 Postural kyphosis, thoracic region: Secondary | ICD-10-CM | POA: Diagnosis not present

## 2015-10-01 DIAGNOSIS — M4004 Postural kyphosis, thoracic region: Secondary | ICD-10-CM | POA: Diagnosis not present

## 2015-10-01 DIAGNOSIS — M9902 Segmental and somatic dysfunction of thoracic region: Secondary | ICD-10-CM | POA: Diagnosis not present

## 2015-10-01 DIAGNOSIS — M47812 Spondylosis without myelopathy or radiculopathy, cervical region: Secondary | ICD-10-CM | POA: Diagnosis not present

## 2015-10-01 DIAGNOSIS — S338XXA Sprain of other parts of lumbar spine and pelvis, initial encounter: Secondary | ICD-10-CM | POA: Diagnosis not present

## 2015-10-01 DIAGNOSIS — M47816 Spondylosis without myelopathy or radiculopathy, lumbar region: Secondary | ICD-10-CM | POA: Diagnosis not present

## 2015-10-01 DIAGNOSIS — M9903 Segmental and somatic dysfunction of lumbar region: Secondary | ICD-10-CM | POA: Diagnosis not present

## 2015-10-01 DIAGNOSIS — M9901 Segmental and somatic dysfunction of cervical region: Secondary | ICD-10-CM | POA: Diagnosis not present

## 2015-10-14 DIAGNOSIS — N202 Calculus of kidney with calculus of ureter: Secondary | ICD-10-CM | POA: Diagnosis not present

## 2015-10-14 DIAGNOSIS — M549 Dorsalgia, unspecified: Secondary | ICD-10-CM | POA: Diagnosis not present

## 2015-10-14 DIAGNOSIS — G4733 Obstructive sleep apnea (adult) (pediatric): Secondary | ICD-10-CM | POA: Diagnosis not present

## 2015-10-14 DIAGNOSIS — I10 Essential (primary) hypertension: Secondary | ICD-10-CM | POA: Diagnosis not present

## 2015-10-14 DIAGNOSIS — Z1389 Encounter for screening for other disorder: Secondary | ICD-10-CM | POA: Diagnosis not present

## 2015-10-14 DIAGNOSIS — R3129 Other microscopic hematuria: Secondary | ICD-10-CM | POA: Diagnosis not present

## 2015-10-14 DIAGNOSIS — F321 Major depressive disorder, single episode, moderate: Secondary | ICD-10-CM | POA: Diagnosis not present

## 2015-10-14 DIAGNOSIS — F419 Anxiety disorder, unspecified: Secondary | ICD-10-CM | POA: Diagnosis not present

## 2015-10-14 DIAGNOSIS — F329 Major depressive disorder, single episode, unspecified: Secondary | ICD-10-CM | POA: Diagnosis not present

## 2015-10-14 DIAGNOSIS — M546 Pain in thoracic spine: Secondary | ICD-10-CM | POA: Diagnosis not present

## 2015-10-14 DIAGNOSIS — Z6831 Body mass index (BMI) 31.0-31.9, adult: Secondary | ICD-10-CM | POA: Diagnosis not present

## 2015-10-14 DIAGNOSIS — R93421 Abnormal radiologic findings on diagnostic imaging of right kidney: Secondary | ICD-10-CM | POA: Diagnosis not present

## 2015-10-17 DIAGNOSIS — N2889 Other specified disorders of kidney and ureter: Secondary | ICD-10-CM | POA: Diagnosis not present

## 2015-10-25 DIAGNOSIS — D3000 Benign neoplasm of unspecified kidney: Secondary | ICD-10-CM | POA: Diagnosis not present

## 2015-10-29 DIAGNOSIS — Z6831 Body mass index (BMI) 31.0-31.9, adult: Secondary | ICD-10-CM | POA: Diagnosis not present

## 2015-10-29 DIAGNOSIS — N2889 Other specified disorders of kidney and ureter: Secondary | ICD-10-CM | POA: Diagnosis not present

## 2015-10-29 DIAGNOSIS — J209 Acute bronchitis, unspecified: Secondary | ICD-10-CM | POA: Diagnosis not present

## 2015-10-29 DIAGNOSIS — E119 Type 2 diabetes mellitus without complications: Secondary | ICD-10-CM | POA: Diagnosis not present

## 2015-10-29 DIAGNOSIS — F329 Major depressive disorder, single episode, unspecified: Secondary | ICD-10-CM | POA: Diagnosis not present

## 2015-10-29 DIAGNOSIS — G4700 Insomnia, unspecified: Secondary | ICD-10-CM | POA: Diagnosis not present

## 2015-10-29 DIAGNOSIS — Z1389 Encounter for screening for other disorder: Secondary | ICD-10-CM | POA: Diagnosis not present

## 2015-10-29 DIAGNOSIS — F419 Anxiety disorder, unspecified: Secondary | ICD-10-CM | POA: Diagnosis not present

## 2015-11-18 DIAGNOSIS — D3000 Benign neoplasm of unspecified kidney: Secondary | ICD-10-CM | POA: Diagnosis not present

## 2015-11-20 ENCOUNTER — Other Ambulatory Visit: Payer: Self-pay | Admitting: Internal Medicine

## 2015-11-20 DIAGNOSIS — Z1231 Encounter for screening mammogram for malignant neoplasm of breast: Secondary | ICD-10-CM

## 2015-11-22 ENCOUNTER — Ambulatory Visit
Admission: RE | Admit: 2015-11-22 | Discharge: 2015-11-22 | Disposition: A | Discharge status: Home / Self Care | Payer: Medicare Other | Source: Ambulatory Visit | Attending: Internal Medicine | Admitting: Internal Medicine

## 2015-11-22 DIAGNOSIS — Z1231 Encounter for screening mammogram for malignant neoplasm of breast: Secondary | ICD-10-CM

## 2015-11-26 DIAGNOSIS — Z23 Encounter for immunization: Secondary | ICD-10-CM | POA: Diagnosis not present

## 2015-11-26 DIAGNOSIS — K5909 Other constipation: Secondary | ICD-10-CM | POA: Diagnosis not present

## 2015-11-26 DIAGNOSIS — Z6831 Body mass index (BMI) 31.0-31.9, adult: Secondary | ICD-10-CM | POA: Diagnosis not present

## 2015-11-26 DIAGNOSIS — F419 Anxiety disorder, unspecified: Secondary | ICD-10-CM | POA: Diagnosis not present

## 2015-11-26 DIAGNOSIS — Z1389 Encounter for screening for other disorder: Secondary | ICD-10-CM | POA: Diagnosis not present

## 2015-11-26 DIAGNOSIS — N2889 Other specified disorders of kidney and ureter: Secondary | ICD-10-CM | POA: Diagnosis not present

## 2015-12-03 ENCOUNTER — Other Ambulatory Visit: Payer: Self-pay | Admitting: Urology

## 2015-12-03 ENCOUNTER — Ambulatory Visit (HOSPITAL_COMMUNITY)
Admission: RE | Admit: 2015-12-03 | Discharge: 2015-12-03 | Disposition: A | Discharge status: Home / Self Care | Payer: Medicare Other | Source: Ambulatory Visit | Attending: Urology | Admitting: Urology

## 2015-12-03 DIAGNOSIS — D49511 Neoplasm of unspecified behavior of right kidney: Secondary | ICD-10-CM

## 2015-12-03 DIAGNOSIS — C641 Malignant neoplasm of right kidney, except renal pelvis: Secondary | ICD-10-CM | POA: Diagnosis not present

## 2015-12-04 ENCOUNTER — Other Ambulatory Visit: Payer: Self-pay | Admitting: Urology

## 2015-12-04 DIAGNOSIS — D4101 Neoplasm of uncertain behavior of right kidney: Secondary | ICD-10-CM

## 2015-12-04 DIAGNOSIS — D49511 Neoplasm of unspecified behavior of right kidney: Secondary | ICD-10-CM

## 2015-12-12 ENCOUNTER — Other Ambulatory Visit: Payer: Self-pay | Admitting: Urology

## 2015-12-12 ENCOUNTER — Inpatient Hospital Stay
Admission: RE | Admit: 2015-12-12 | Discharge: 2015-12-12 | Disposition: A | Discharge status: Home / Self Care | Payer: Self-pay | Source: Ambulatory Visit | Attending: Urology | Admitting: Urology

## 2015-12-12 DIAGNOSIS — R52 Pain, unspecified: Secondary | ICD-10-CM

## 2015-12-17 ENCOUNTER — Other Ambulatory Visit: Payer: Self-pay | Admitting: Radiology

## 2015-12-18 ENCOUNTER — Ambulatory Visit (HOSPITAL_COMMUNITY)
Admission: RE | Admit: 2015-12-18 | Discharge: 2015-12-18 | Disposition: A | Discharge status: Home / Self Care | Payer: Medicare Other | Source: Ambulatory Visit | Attending: Urology | Admitting: Urology

## 2015-12-18 ENCOUNTER — Encounter (HOSPITAL_COMMUNITY): Payer: Self-pay

## 2015-12-18 DIAGNOSIS — I10 Essential (primary) hypertension: Secondary | ICD-10-CM | POA: Insufficient documentation

## 2015-12-18 DIAGNOSIS — Z9049 Acquired absence of other specified parts of digestive tract: Secondary | ICD-10-CM | POA: Insufficient documentation

## 2015-12-18 DIAGNOSIS — Z90710 Acquired absence of both cervix and uterus: Secondary | ICD-10-CM | POA: Diagnosis not present

## 2015-12-18 DIAGNOSIS — D4101 Neoplasm of uncertain behavior of right kidney: Secondary | ICD-10-CM

## 2015-12-18 DIAGNOSIS — C641 Malignant neoplasm of right kidney, except renal pelvis: Secondary | ICD-10-CM | POA: Insufficient documentation

## 2015-12-18 DIAGNOSIS — E7800 Pure hypercholesterolemia, unspecified: Secondary | ICD-10-CM | POA: Insufficient documentation

## 2015-12-18 DIAGNOSIS — N289 Disorder of kidney and ureter, unspecified: Secondary | ICD-10-CM | POA: Insufficient documentation

## 2015-12-18 DIAGNOSIS — D49511 Neoplasm of unspecified behavior of right kidney: Secondary | ICD-10-CM | POA: Diagnosis present

## 2015-12-18 DIAGNOSIS — Z87891 Personal history of nicotine dependence: Secondary | ICD-10-CM | POA: Insufficient documentation

## 2015-12-18 DIAGNOSIS — N2889 Other specified disorders of kidney and ureter: Secondary | ICD-10-CM | POA: Diagnosis not present

## 2015-12-18 LAB — CBC WITH DIFFERENTIAL/PLATELET
BASOS PCT: 1 %
Basophils Absolute: 0.1 10*3/uL (ref 0.0–0.1)
Eosinophils Absolute: 0.1 10*3/uL (ref 0.0–0.7)
Eosinophils Relative: 1 %
HEMATOCRIT: 38.7 % (ref 36.0–46.0)
HEMOGLOBIN: 12.8 g/dL (ref 12.0–15.0)
LYMPHS ABS: 2.1 10*3/uL (ref 0.7–4.0)
Lymphocytes Relative: 28 %
MCH: 28.9 pg (ref 26.0–34.0)
MCHC: 33.1 g/dL (ref 30.0–36.0)
MCV: 87.4 fL (ref 78.0–100.0)
MONOS PCT: 6 %
Monocytes Absolute: 0.5 10*3/uL (ref 0.1–1.0)
NEUTROS ABS: 4.9 10*3/uL (ref 1.7–7.7)
NEUTROS PCT: 64 %
Platelets: 229 10*3/uL (ref 150–400)
RBC: 4.43 MIL/uL (ref 3.87–5.11)
RDW: 13.4 % (ref 11.5–15.5)
WBC: 7.7 10*3/uL (ref 4.0–10.5)

## 2015-12-18 LAB — BASIC METABOLIC PANEL
ANION GAP: 7 (ref 5–15)
BUN: 22 mg/dL — ABNORMAL HIGH (ref 6–20)
CHLORIDE: 107 mmol/L (ref 101–111)
CO2: 24 mmol/L (ref 22–32)
Calcium: 9.4 mg/dL (ref 8.9–10.3)
Creatinine, Ser: 0.97 mg/dL (ref 0.44–1.00)
GFR calc non Af Amer: 60 mL/min — ABNORMAL LOW (ref >60)
Glucose, Bld: 110 mg/dL — ABNORMAL HIGH (ref 65–99)
Potassium: 4.1 mmol/L (ref 3.5–5.1)
Sodium: 138 mmol/L (ref 135–145)

## 2015-12-18 LAB — PROTIME-INR
INR: 0.92
Prothrombin Time: 12.4 seconds (ref 11.4–15.2)

## 2015-12-18 MED ORDER — SIMETHICONE 80 MG PO CHEW
80.0000 mg | CHEWABLE_TABLET | Freq: Once | ORAL | Status: DC
  Filled 2015-12-18: qty 1

## 2015-12-18 MED ORDER — FENTANYL CITRATE (PF) 100 MCG/2ML IJ SOLN
INTRAMUSCULAR | Status: AC | PRN
  Administered 2015-12-18: 50 ug via INTRAVENOUS
  Administered 2015-12-18 (×2): 25 ug via INTRAVENOUS

## 2015-12-18 MED ORDER — HYDROCODONE-ACETAMINOPHEN 5-325 MG PO TABS
1.0000 | ORAL_TABLET | ORAL | Status: DC | PRN
  Administered 2015-12-18: 1 via ORAL
  Filled 2015-12-18: qty 1

## 2015-12-18 MED ORDER — SODIUM CHLORIDE 0.9 % IV SOLN
INTRAVENOUS | Status: DC
  Administered 2015-12-18: 07:00:00 via INTRAVENOUS

## 2015-12-18 MED ORDER — MIDAZOLAM HCL 2 MG/2ML IJ SOLN
INTRAMUSCULAR | Status: AC | PRN
  Administered 2015-12-18 (×6): 1 mg via INTRAVENOUS

## 2015-12-18 MED ORDER — ALUM & MAG HYDROXIDE-SIMETH 200-200-20 MG/5ML PO SUSP
30.0000 mL | Freq: Once | ORAL | Status: AC
  Administered 2015-12-18: 30 mL via ORAL
  Filled 2015-12-18 (×2): qty 30

## 2015-12-18 MED ORDER — FENTANYL CITRATE (PF) 100 MCG/2ML IJ SOLN
INTRAMUSCULAR | Status: AC
  Filled 2015-12-18: qty 4

## 2015-12-18 MED ORDER — MIDAZOLAM HCL 2 MG/2ML IJ SOLN
INTRAMUSCULAR | Status: AC
  Filled 2015-12-18: qty 6

## 2015-12-18 NOTE — Procedures (Signed)
Interventional Radiology Procedure Note  Procedure: CT guided biopsy of right renal mass.  Complications: None   Estimated Blood Loss: None  Recommendations: - Bedrest x 4 hrs - DC home     Signed,  Criselda Peaches, MD

## 2015-12-18 NOTE — Discharge Instructions (Signed)
Needle Biopsy, Care After °These instructions give you information about caring for yourself after your procedure. Your doctor may also give you more specific instructions. Call your doctor if you have any problems or questions after your procedure. °HOME CARE °· Rest as told by your doctor. °· Take medicines only as told by your doctor. °· There are many different ways to close and cover the biopsy site, including stitches (sutures), skin glue, and adhesive strips. Follow instructions from your doctor about: °¨ How to take care of your biopsy site. °¨ When and how you should change your bandage (dressing). °¨ When you should remove your dressing. °¨ Removing whatever was used to close your biopsy site. °· Check your biopsy site every day for signs of infection. Watch for: °¨ Redness, swelling, or pain. °¨ Fluid, blood, or pus. °GET HELP IF: °· You have a fever. °· You have redness, swelling, or pain at the biopsy site, and it lasts longer than a few days. °· You have fluid, blood, or pus coming from the biopsy site. °· You feel sick to your stomach (nauseous). °· You throw up (vomit). °GET HELP RIGHT AWAY IF: °· You are short of breath. °· You have trouble breathing. °· Your chest hurts. °· You feel dizzy or you pass out (faint). °· You have bleeding that does not stop with pressure or a bandage. °· You cough up blood. °· Your belly (abdomen) hurts. °  °This information is not intended to replace advice given to you by your health care provider. Make sure you discuss any questions you have with your health care provider. °  °Document Released: 04/02/2008 Document Revised: 09/04/2014 Document Reviewed: 04/16/2014 °Elsevier Interactive Patient Education ©2016 [employer] °Moderate Conscious Sedation, Adult, Care After °Refer to this sheet in the next few weeks. These instructions provide you with information on caring for yourself after your procedure. Your health care provider may also give you more specific  instructions. Your treatment has been planned according to current medical practices, but problems sometimes occur. Call your health care provider if you have any problems or questions after your procedure. °WHAT TO EXPECT AFTER THE PROCEDURE  °After your procedure: °· You may feel sleepy, clumsy, and have poor balance for several hours. °· Vomiting may occur if you eat too soon after the procedure. °HOME CARE INSTRUCTIONS °· Do not participate in any activities where you could become injured for at least 24 hours. Do not: °· Drive. °· Swim. °· Ride a bicycle. °· Operate heavy machinery. °· Cook. °· Use power tools. °· Climb ladders. °· Work from a high place. °· Do not make important decisions or sign legal documents until you are improved. °· If you vomit, drink water, juice, or soup when you can drink without vomiting. Make sure you have little or no nausea before eating solid foods. °· Only take over-the-counter or prescription medicines for pain, discomfort, or fever as directed by your health care provider. °· Make sure you and your family fully understand everything about the medicines given to you, including what side effects may occur. °· You should not drink alcohol, take sleeping pills, or take medicines that cause drowsiness for at least 24 hours. °· If you smoke, do not smoke without supervision. °· If you are feeling better, you may resume normal activities 24 hours after you were sedated. °· Keep all appointments with your health care provider. °SEEK MEDICAL CARE IF: °· Your skin is pale or bluish in color. °· You   continue to feel nauseous or vomit. °· Your pain is getting worse and is not helped by medicine. °· You have bleeding or swelling. °· You are still sleepy or feeling clumsy after 24 hours. °SEEK IMMEDIATE MEDICAL CARE IF: °· You develop a rash. °· You have difficulty breathing. °· You develop any type of allergic problem. °· You have a fever. °MAKE SURE YOU: °· Understand these  instructions. °· Will watch your condition. °· Will get help right away if you are not doing well or get worse. °  °This information is not intended to replace advice given to you by your health care provider. Make sure you discuss any questions you have with your health care provider. °  °Document Released: 02/08/2013 Document Revised: 05/11/2014 Document Reviewed: 02/08/2013 °Elsevier Interactive Patient Education ©2016 Elsevier Inc. ° °

## 2015-12-18 NOTE — Consult Note (Signed)
Chief Complaint: Patient was seen in consultation today for image guided right renal mass biopsy  Referring Physician(s): Herrick,Benjamin W  Supervising Physician: Jacqulynn Cadet  Patient Status: Outpatient  History of Present Illness: [name] is a 65 y.o. female with history of hypertension, hyperlipidemia, hypercholesterolemia, prior cholecystectomy and hysterectomy who has recently been experiencing some right upper quadrant and right flank discomfort with occasional nausea. Subsequent imaging at outside facility on 10/17/15 showed a 3.1 cm right lower pole renal mass. She has been evaluated by urology and request now received for biopsy of the renal mass. She presents today for the renal mass biopsy. She has no prior history of cancer.  Past Medical History:  Diagnosis Date  . History of hysterectomy   . Hx of cholecystectomy   . Hypercholesterolemia   . Hyperlipidemia   . Hypertension   . Renal insufficiency     Past Surgical History:  Procedure Laterality Date  . ABDOMINAL HYSTERECTOMY    . CESAREAN SECTION      Allergies: Ace inhibitors; Norvasc [amlodipine besylate]; Nsaids; and Omnipaque [iohexol]  Medications: Prior to Admission medications   Medication Sig Start Date End Date Taking? Authorizing Provider  Cinnamon 500 MG TABS Take 500 mg by mouth 2 (two) times daily.   Yes Historical Provider, MD  clonazePAM (KLONOPIN) 0.5 MG tablet Take 0.5 mg by mouth at bedtime.    Yes Historical Provider, MD  Coenzyme Q10 (COQ10) 100 MG CAPS Take by mouth daily.   Yes Historical Provider, MD  fish oil-omega-3 fatty acids 1000 MG capsule Take 2 g by mouth daily.    Yes Historical Provider, MD  KRILL OIL PO Take 1 tablet by mouth daily.   Yes Historical Provider, MD  Multiple Vitamins-Minerals (MULTIVITAMIN WITH MINERALS) tablet Take 1 tablet by mouth daily.     Yes Historical Provider, MD  sertraline (ZOLOFT) 100 MG tablet Take 100 mg by mouth daily.  11/21/12   Yes Historical Provider, MD  valsartan (DIOVAN) 80 MG tablet Take 80 mg by mouth daily.     Yes Historical Provider, MD  ezetimibe (ZETIA) 10 MG tablet Take 10 mg by mouth daily.    Historical Provider, MD  HYDROcodone-acetaminophen (NORCO/VICODIN) 5-325 MG per tablet Take 1 tablet by mouth every 4 (four) hours as needed for moderate pain or severe pain. 11/01/13   Claudean Severance, MD  HYDROcodone-acetaminophen (NORCO/VICODIN) 5-325 MG per tablet Take 1-2 tablets by mouth every 4 (four) hours as needed for moderate pain or severe pain. 11/02/13   Alvina Chou, PA-C     History reviewed. No pertinent family history.  Social History   Social History  . Marital status: Married    Spouse name: N/A  . Number of children: N/A  . Years of education: N/A   Social History Main Topics  . Smoking status: Former Research scientist (life sciences)  . Smokeless tobacco: Never Used  . Alcohol use No  . Drug use: No  . Sexual activity: No   Other Topics Concern  . None   Social History Narrative  . None      Review of Systems  denies fever, chest pain, dyspnea , gross hematuria/dysuria or vomiting; she does have occasional headaches, occ cough, nausea and right abdominal/flank discomfort; she is also quite anxious.  Vital Signs: BP (!) 152/85 (BP Location: Left Arm)   Pulse 82   Temp 99 F (37.2 C) (Oral)   Resp 18   SpO2 98%   Physical Exam patient awake,  alert. Chest clear to auscultation bilaterally. Heart with regular rate and rhythm. Abdomen obese, soft, positive bowel sounds, mild right upper quadrant and right CVA tenderness to palpation; lower extremities with no edema  Mallampati Score:     Imaging: Dg Chest 2 View  Result Date: 12/04/2015 CLINICAL DATA:  Right renal neoplasm EXAM: CHEST  2 VIEW COMPARISON:  10/09/2014 FINDINGS: The heart size and mediastinal contours are within normal limits. Both lungs are clear. The visualized skeletal structures are unremarkable. IMPRESSION: No active  cardiopulmonary disease. Electronically Signed   By: Inez Catalina M.D.   On: 12/04/2015 08:21   Mm Screening Breast Tomo Bilateral  Result Date: 11/22/2015 CLINICAL DATA:  Screening. EXAM: 2D DIGITAL SCREENING BILATERAL MAMMOGRAM WITH CAD AND ADJUNCT TOMO COMPARISON:  Previous exam(s). ACR Breast Density Category b: There are scattered areas of fibroglandular density. FINDINGS: There are no findings suspicious for malignancy. Images were processed with CAD. IMPRESSION: No mammographic evidence of malignancy. A result letter of this screening mammogram will be mailed directly to the patient. RECOMMENDATION: Screening mammogram in one year. (Code:SM-B-01Y) BI-RADS CATEGORY  1: Negative. Electronically Signed   By: Fidela Salisbury M.D.   On: 11/22/2015 12:25   Ct Outside Films Body  Result Date: 12/12/2015 This examination belongs to an outside facility and is stored here for comparison purposes only.  Contact the originating outside institution for any associated report or interpretation.   Labs:  CBC:  Recent Labs  12/18/15 0715  WBC 7.7  HGB 12.8  HCT 38.7  PLT 229    COAGS:  Recent Labs  12/18/15 0715  INR 0.92    BMP:  Recent Labs  12/18/15 0715  NA 138  K 4.1  CL 107  CO2 24  GLUCOSE 110*  BUN 22*  CALCIUM 9.4  CREATININE 0.97  GFRNONAA 60*  GFRAA >60    LIVER FUNCTION TESTS: No results for input(s): BILITOT, AST, ALT, ALKPHOS, PROT, ALBUMIN in the last 8760 hours.  TUMOR MARKERS: No results for input(s): AFPTM, CEA, CA199, CHROMGRNA in the last 8760 hours.  Assessment and Plan: 65 y.o. female with history of hypertension, hyperlipidemia, hypercholesterolemia, prior cholecystectomy and hysterectomy who has recently been experiencing some right upper quadrant and right flank discomfort with occasional nausea. Subsequent imaging at outside facility on 10/17/15 showed a 3.1 cm right lower pole renal mass. She has been evaluated by urology and request now  received for biopsy of the renal mass. She presents today for the renal mass biopsy. She has no prior history of cancer.Risks and benefits discussed with the patient/husband including, but not limited to bleeding, infection, damage to adjacent structures or low yield requiring additional tests.All of the patient's questions were answered, patient is agreeable to proceed.Consent signed and in chart.     Thank you for this interesting consult.  I greatly enjoyed meeting AREIL WHISENANT and look forward to participating in their care.  A copy of this report was sent to the requesting provider on this date.  Electronically Signed: D. Rowe Robert 12/18/2015, 8:36 AM   I spent a total of  20 minutes   in face to face in clinical consultation, greater than 50% of which was counseling/coordinating care for image guided biopsy of right renal mass

## 2015-12-25 ENCOUNTER — Other Ambulatory Visit: Payer: Self-pay | Admitting: Urology

## 2016-01-15 DIAGNOSIS — F411 Generalized anxiety disorder: Secondary | ICD-10-CM | POA: Diagnosis not present

## 2016-01-21 NOTE — Patient Instructions (Addendum)
[name]  01/21/2016   Your procedure is scheduled on: 01/27/2016    Report to [facility]  Entrance take [place]  elevators to 3rd floor to  [place] at   [place] AM.  Call this number if you have problems the morning of surgery [phone]   Remember: ONLY 1 PERSON MAY GO WITH YOU TO SHORT STAY TO GET  READY MORNING OF YOUR SURGERY.  Do not eat food or drink liquids :After Midnight.             Magnesium Citrate- one bottle drink by 12 noon day before surgery.               Fleets enema - nite before surgery              Clear Liquid diet the day before surgery.       Take these medicines the morning of surgery with A SIP OF WATER: Clonazepam ( Klonopin), Zoloft                                 You may not have any metal on your body including hair pins and              piercings  Do not wear jewelry, make-up, lotions, powders or perfumes, deodorant             Do not wear nail polish.  Do not shave  48 hours prior to surgery.     Do not bring valuables to the hospital. [place].  Contacts, dentures or bridgework may not be worn into surgery.  Leave suitcase in the car. After surgery it may be brought to your room.     _____________________________________________________________________             [facility] - Preparing for Surgery Before surgery, you can play an important role.  Because skin is not sterile, your skin needs to be as free of germs as possible.  You can reduce the number of germs on your skin by washing with CHG (chlorahexidine gluconate) soap before surgery.  CHG is an antiseptic cleaner which kills germs and bonds with the skin to continue killing germs even after washing. Please DO NOT use if you have an allergy to CHG or antibacterial soaps.  If your skin becomes reddened/irritated stop using the CHG and inform your nurse when you arrive at Short Stay. Do not shave  (including legs and underarms) for at least 48 hours prior to the first CHG shower.  You may shave your face/neck. Please follow these instructions carefully:  1.  Shower with CHG Soap the night before surgery and the  morning of Surgery.  2.  If you choose to wash your hair, wash your hair first as usual with your  normal  shampoo.  3.  After you shampoo, rinse your hair and body thoroughly to remove the  shampoo.                           4.  Use CHG as you would any other liquid soap.  You can apply chg directly  to the skin and wash  Gently with a scrungie or clean washcloth.  5.  Apply the CHG Soap to your body ONLY FROM THE NECK DOWN.   Do not use on face/ open                           Wound or open sores. Avoid contact with eyes, ears mouth and genitals (private parts).                       Wash face,  Genitals (private parts) with your normal soap.             6.  Wash thoroughly, paying special attention to the area where your surgery  will be performed.  7.  Thoroughly rinse your body with warm water from the neck down.  8.  DO NOT shower/wash with your normal soap after using and rinsing off  the CHG Soap.                9.  Pat yourself dry with a clean towel.            10.  Wear clean pajamas.            11.  Place clean sheets on your bed the night of your first shower and do not  sleep with pets. Day of Surgery : Do not apply any lotions/deodorants the morning of surgery.  Please wear clean clothes to the hospital/surgery center.  FAILURE TO FOLLOW THESE INSTRUCTIONS MAY RESULT IN THE CANCELLATION OF YOUR SURGERY PATIENT SIGNATURE_________________________________  NURSE SIGNATURE__________________________________  ________________________________________________________________________    CLEAR LIQUID DIET   Foods Allowed                                                                     Foods Excluded  Coffee and tea, regular and decaf                              liquids that you cannot  Plain Jell-O in any flavor                                             see through such as: Fruit ices (not with fruit pulp)                                     milk, soups, orange juice  Iced Popsicles                                    All solid food Carbonated beverages, regular and diet                                    Cranberry, grape and apple juices Sports drinks like Gatorade Lightly seasoned clear broth or consume(fat free) Sugar, honey syrup  Sample Menu Breakfast                                Lunch                                     Supper Cranberry juice                    Beef broth                            Chicken broth Jell-O                                     Grape juice                           Apple juice Coffee or tea                        Jell-O                                      Popsicle                                                Coffee or tea                        Coffee or tea  _____________________________________________________________________   WHAT IS A BLOOD TRANSFUSION? Blood Transfusion Information  A transfusion is the replacement of blood or some of its parts. Blood is made up of multiple cells which provide different functions.  Red blood cells carry oxygen and are used for blood loss replacement.  White blood cells fight against infection.  Platelets control bleeding.  Plasma helps clot blood.  Other blood products are available for specialized needs, such as hemophilia or other clotting disorders. BEFORE THE TRANSFUSION  Who gives blood for transfusions?   Healthy volunteers who are fully evaluated to make sure their blood is safe. This is blood bank blood. Transfusion therapy is the safest it has ever been in the practice of medicine. Before blood is taken from a donor, a complete history is taken to make sure that person has no history of diseases nor engages in risky social behavior  (examples are intravenous drug use or sexual activity with multiple partners). The donor's travel history is screened to minimize risk of transmitting infections, such as malaria. The donated blood is tested for signs of infectious diseases, such as HIV and hepatitis. The blood is then tested to be sure it is compatible with you in order to minimize the chance of a transfusion reaction. If you or a relative donates blood, this is often done in anticipation of surgery and is not appropriate for emergency situations. It takes many days to process the donated blood. RISKS AND COMPLICATIONS Although transfusion therapy is very safe and saves many lives, the main dangers of transfusion include:  Getting an infectious disease.  Developing a transfusion reaction. This is an allergic reaction to something in the blood you were given. Every precaution is taken to prevent this. The decision to have a blood transfusion has been considered carefully by your caregiver before blood is given. Blood is not given unless the benefits outweigh the risks. AFTER THE TRANSFUSION  Right after receiving a blood transfusion, you will usually feel much better and more energetic. This is especially true if your red blood cells have gotten low (anemic). The transfusion raises the level of the red blood cells which carry oxygen, and this usually causes an energy increase.  The nurse administering the transfusion will monitor you carefully for complications. HOME CARE INSTRUCTIONS  No special instructions are needed after a transfusion. You may find your energy is better. Speak with your caregiver about any limitations on activity for underlying diseases you may have. SEEK MEDICAL CARE IF:   Your condition is not improving after your transfusion.  You develop redness or irritation at the intravenous (IV) site. SEEK IMMEDIATE MEDICAL CARE IF:  Any of the following symptoms occur over the next 12 hours:  Shaking  chills.  You have a temperature by mouth above 102 F (38.9 C), not controlled by medicine.  Chest, back, or muscle pain.  People around you feel you are not acting correctly or are confused.  Shortness of breath or difficulty breathing.  Dizziness and fainting.  You get a rash or develop hives.  You have a decrease in urine output.  Your urine turns a dark color or changes to pink, red, or brown. Any of the following symptoms occur over the next 10 days:  You have a temperature by mouth above 102 F (38.9 C), not controlled by medicine.  Shortness of breath.  Weakness after normal activity.  The white part of the eye turns yellow (jaundice).  You have a decrease in the amount of urine or are urinating less often.  Your urine turns a dark color or changes to pink, red, or brown. Document Released: 04/17/2000 Document Revised: 07/13/2011 Document Reviewed: 12/05/2007 ExitCare Patient Information 2014 [place].  _______________________________________________________________________  Incentive Spirometer  An incentive spirometer is a tool that can help keep your lungs clear and active. This tool measures how well you are filling your lungs with each breath. Taking long deep breaths may help reverse or decrease the chance of developing breathing (pulmonary) problems (especially infection) following:  A long period of time when you are unable to move or be active. BEFORE THE PROCEDURE   If the spirometer includes an indicator to show your best effort, your nurse or respiratory therapist will set it to a desired goal.  If possible, sit up straight or lean slightly forward. Try not to slouch.  Hold the incentive spirometer in an upright position. INSTRUCTIONS FOR USE  1. Sit on the edge of your bed if possible, or sit up as far as you can in bed or on a chair. 2. Hold the incentive spirometer in an upright position. 3. Breathe out normally. 4. Place the  mouthpiece in your mouth and seal your lips tightly around it. 5. Breathe in slowly and as deeply as possible, raising the piston or the ball toward the top of the column. 6. Hold your breath for 3-5 seconds or for as long as possible. Allow the piston or ball to fall to the bottom of the column. 7. Remove the mouthpiece from your mouth and breathe out normally.  8. Rest for a few seconds and repeat Steps 1 through 7 at least 10 times every 1-2 hours when you are awake. Take your time and take a few normal breaths between deep breaths. 9. The spirometer may include an indicator to show your best effort. Use the indicator as a goal to work toward during each repetition. 10. After each set of 10 deep breaths, practice coughing to be sure your lungs are clear. If you have an incision (the cut made at the time of surgery), support your incision when coughing by placing a pillow or rolled up towels firmly against it. Once you are able to get out of bed, walk around indoors and cough well. You may stop using the incentive spirometer when instructed by your caregiver.  RISKS AND COMPLICATIONS  Take your time so you do not get dizzy or light-headed.  If you are in pain, you may need to take or ask for pain medication before doing incentive spirometry. It is harder to take a deep breath if you are having pain. AFTER USE  Rest and breathe slowly and easily.  It can be helpful to keep track of a log of your progress. Your caregiver can provide you with a simple table to help with this. If you are using the spirometer at home, follow these instructions: [place] IF:   You are having difficultly using the spirometer.  You have trouble using the spirometer as often as instructed.  Your pain medication is not giving enough relief while using the spirometer.  You develop fever of 100.5 F (38.1 C) or higher. SEEK IMMEDIATE MEDICAL CARE IF:   You cough up bloody sputum that had not been present  before.  You develop fever of 102 F (38.9 C) or greater.  You develop worsening pain at or near the incision site. MAKE SURE YOU:   Understand these instructions.  Will watch your condition.  Will get help right away if you are not doing well or get worse. Document Released: 08/31/2006 Document Revised: 07/13/2011 Document Reviewed: 11/01/2006 [facility] Patient Information 2014 [place], [place].   ________________________________________________________________________

## 2016-01-22 ENCOUNTER — Encounter (HOSPITAL_COMMUNITY)
Admission: RE | Admit: 2016-01-22 | Discharge: 2016-01-22 | Disposition: A | Discharge status: Home / Self Care | Payer: Medicare Other | Source: Ambulatory Visit | Attending: Urology | Admitting: Urology

## 2016-01-22 ENCOUNTER — Encounter (HOSPITAL_COMMUNITY): Payer: Self-pay

## 2016-01-22 DIAGNOSIS — Z01812 Encounter for preprocedural laboratory examination: Secondary | ICD-10-CM | POA: Diagnosis not present

## 2016-01-22 DIAGNOSIS — Z01810 Encounter for preprocedural cardiovascular examination: Secondary | ICD-10-CM | POA: Diagnosis not present

## 2016-01-22 DIAGNOSIS — R457 State of emotional shock and stress, unspecified: Secondary | ICD-10-CM

## 2016-01-22 HISTORY — DX: Reserved for inherently not codable concepts without codable children: IMO0001

## 2016-01-22 HISTORY — DX: Major depressive disorder, single episode, unspecified: F32.9

## 2016-01-22 HISTORY — DX: Nausea with vomiting, unspecified: R11.2

## 2016-01-22 HISTORY — DX: State of emotional shock and stress, unspecified: R45.7

## 2016-01-22 HISTORY — DX: Depression, unspecified: F32.A

## 2016-01-22 HISTORY — DX: Prediabetes: R73.03

## 2016-01-22 HISTORY — DX: Unspecified osteoarthritis, unspecified site: M19.90

## 2016-01-22 HISTORY — DX: Other specified postprocedural states: Z98.890

## 2016-01-22 HISTORY — DX: Sleep apnea, unspecified: G47.30

## 2016-01-22 HISTORY — DX: Anxiety disorder, unspecified: F41.9

## 2016-01-22 LAB — ABO/RH: ABO/RH(D): A POS

## 2016-01-22 LAB — BASIC METABOLIC PANEL
ANION GAP: 8 (ref 5–15)
BUN: 24 mg/dL — AB (ref 6–20)
CHLORIDE: 106 mmol/L (ref 101–111)
CO2: 26 mmol/L (ref 22–32)
Calcium: 9.6 mg/dL (ref 8.9–10.3)
Creatinine, Ser: 0.94 mg/dL (ref 0.44–1.00)
GFR calc Af Amer: >60 mL/min (ref >60)
GFR calc non Af Amer: >60 mL/min (ref >60)
GLUCOSE: 95 mg/dL (ref 65–99)
POTASSIUM: 4.8 mmol/L (ref 3.5–5.1)
SODIUM: 140 mmol/L (ref 135–145)

## 2016-01-22 LAB — CBC
HEMATOCRIT: 39.8 % (ref 36.0–46.0)
HEMOGLOBIN: 12.6 g/dL (ref 12.0–15.0)
MCH: 28.3 pg (ref 26.0–34.0)
MCHC: 31.7 g/dL (ref 30.0–36.0)
MCV: 89.4 fL (ref 78.0–100.0)
Platelets: 217 10*3/uL (ref 150–400)
RBC: 4.45 MIL/uL (ref 3.87–5.11)
RDW: 13.4 % (ref 11.5–15.5)
WBC: 7.7 10*3/uL (ref 4.0–10.5)

## 2016-01-22 NOTE — Pre-Procedure Instructions (Signed)
BMP results routed to Dr Borden 

## 2016-01-22 NOTE — Pre-Procedure Instructions (Signed)
Patient has anxiety and panic attacks. She is very scared and anxious re pending surgery. CXR in [facility]

## 2016-01-24 NOTE — H&P (Signed)
CC/HPI: Right renal neoplasm    [name] is an extremely anxious 65 year old female seen today at the request of Dr. Burman Nieves for a 3.1 cm right lower pole renal mass. He has a long-standing history of generalized anxiety and in particular has anxiety about her kidneys and kidney function stemming from an incident when she underwent a hysterectomy in 2002. She developed an acute kidney injury possibly related to severe dehydration following a bowel prep and possibly compounded by intraoperative blood loss. She apparently saw a nephrologist for a couple of years and had complete resolution of her acute kidney injury but continues to be extremely concerned and anxious about it.   She underwent a CT scan of the abdomen and pelvis without contrast in mid June due to complaints of right-sided flank pain and a family history of kidney stones. This fortunately did not demonstrate any kidney stones but did demonstrate a poorly characterized lesion in the lower pole of the right kidney. This prompted a CT scan of the abdomen and pelvis with contrast on 10/17/15 they confirmed a 3.1 cm low density lesion that did demonstrate enhancement consistent with a possible renal cell carcinoma. There did appear to be central necrosis of this lesion. She was seen by Dr. Louis Meckel and underwent a consultation. Due to her extreme anxiety, she requested a second opinion and is seen today for that reason.   She has no family history of kidney cancer. She denies hematuria. She denies unintentional weight loss, night sweats, or other complaints. She does continue to have periodic right-sided flank and upper abdominal pain that has been intermittent.     ALLERGIES: Norvasc    MEDICATIONS: Alprazolam 0.5 mg tablet  Ambien 10 mg tablet  Bupropion Hcl 100 mg tablet 1 tablet PO Daily  Clonazepam 0.5 mg tablet  Fluticasone Propionate 50 mcg/actuation spray, suspension 1 ml PO Daily  Multivitamins 1 PO Daily  Pravastatin  Sodium 40 mg tablet 1 tablet PO Daily  Sertraline Hcl 100 mg tablet  Valsartan 80 mg tablet 1 tablet PO Daily  Vitamin C 250 mg tablet 1 tablet PO Daily  Xanax 0.5 mg tablet 1 tablet PO Daily  Zetia 10 mg tablet 1 tablet PO Daily     GU PSH: Hysterectomy    NON-GU PSH: Cesarean Delivery Remove Gallbladder    GU PMH: Benign Neo Kidney, 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  Does not use smokeless tobacco. Does not drink anymore.  Does not use drugs. Does not drink caffeine. Patient's occupation Transport planner.    REVIEW OF SYSTEMS:    GU Review Female:   Patient reports frequent urination, get up at night to urinate, and leakage of urine. Patient denies hard to postpone urination, burning /pain with urination, stream starts and stops, trouble starting your stream, have to strain to urinate, and currently pregnant.  Gastrointestinal (Upper):   Patient denies nausea and vomiting.  Gastrointestinal (Lower):   Patient denies diarrhea and constipation.  Constitutional:   Patient denies fever, night sweats, weight loss, and fatigue.  Skin:   Patient denies skin rash/ lesion and itching.  Eyes:  Patient denies double vision and blurred vision.  Ears/ Nose/ Throat:   Patient denies sore throat and sinus problems.  Hematologic/Lymphatic:   Patient denies swollen glands and easy bruising.  Cardiovascular:   Patient denies leg swelling and chest pains.  Respiratory:   Patient reports cough and shortness of breath.   Endocrine:   Patient  denies excessive thirst.  Musculoskeletal:   Patient denies back pain and joint pain.  Neurological:   Patient denies headaches and dizziness.  Psychologic:   Patient reports depression and anxiety.    VITAL SIGNS:      12/03/2015 03:55 PM  Weight 195 lb / 88.45 kg  Height 67 in / 170.18 cm  BP 140/88 mmHg  Pulse 76 /min  Temperature 98.1 F / 37 C  BMI 30.5 kg/m   MULTI-SYSTEM PHYSICAL EXAMINATION:    Constitutional: Well-nourished. No physical deformities. Normally developed. Good grooming.  Neck: Neck symmetrical, not swollen. Normal tracheal position.  Respiratory: No labored breathing, no use of accessory muscles.   Cardiovascular: Normal temperature, normal extremity pulses, no swelling, no varicosities.  Lymphatic: No enlargement of neck, axillae, groin.  Skin: No paleness, no jaundice, no cyanosis. No lesion, no ulcer, no rash.  Neurologic / Psychiatric: Oriented to time, oriented to place, oriented to person. No depression, no anxiety, no agitation.  Gastrointestinal: No mass, no tenderness, no rigidity, non obese abdomen.  Eyes: Normal conjunctivae. Normal eyelids.  Ears, Nose, Mouth, and Throat: Left ear no scars, no lesions, no masses. Right ear no scars, no lesions, no masses. Nose no scars, no lesions, no masses. Normal hearing. Normal lips.  Musculoskeletal: Normal gait and station of head and neck.     PAST DATA REVIEWED:  Source Of History:  Patient  X-Ray Review: C.T. Abdomen/Pelvis: Reviewed Films.     PROCEDURES: None   ASSESSMENT:      ICD-10 Details  1 GU:   Neoplasm of unspecified behavior of right kidney - D49.511    PLAN:           Orders Labs CMP  X-Rays: Chest X-Ray Outside Without Contrast          Schedule Return Visit: Next Available Appointment             Note: Interventional radiology for biopsy of right renal mass (Lower pole mass noted on [facility]).          Document Letter(s):  Created for Patient: Clinical Summary          Notes:   1) Right renal neoplasm: The patient was provided information regarding their renal mass including the relative risk of benign versus malignant pathology and the natural history of renal cell carcinoma and other possible malignancies of the kidney. The role of renal biopsy, laboratory testing, and imaging studies to further characterize renal masses and/or the presence of metastatic disease were explained. We discussed the role of active surveillance, surgical therapy with both radical nephrectomy and nephron-sparing surgery, and ablative therapy in the treatment of renal masses. In addition, we discussed our goals of providing an accurate diagnosis and oncologic control while maintaining optimal renal function as appropriate based on the size, location, and complexity of their renal mass as well as their co-morbidities.   We have discussed the risks of treatment in detail including but not limited to bleeding, infection, heart attack, stroke, death, venothromoboembolism, cancer recurrence, injury/damage to surrounding organs and structures, urine leak, the possibility of open surgical conversion for patients undergoing minimally invasive surgery,  the risk of developing chronic kidney disease and its associated implications, and the potential risk of end stage renal disease possibly necessitating dialysis.   I had a detailed discussion with [name] and her husband today regarding her right renal neoplasm. We discussed the fact that it most likely represents a malignant lesion although this cannot be definitively determined based on her imaging. She has extreme anxiety about her situation and appears to have as much anxiety about deterioration of her kidney function and undergoing unnecessary surgery if her lesion turns out to be benign as she does about having a potential malignant tumor on her kidney and undergoing surgery and the risks of surgery itself.    After a thorough discussion, she has  elected to proceed with a renal mass biopsy to determine if surgery is absolutely necessary. This would seem reasonable considering her maybe a 20-30% chance of having a benign entity. She also undergo a complete metastatic evaluation including chest imaging and laboratory studies.   If her biopsy confirms a renal malignancy, she is fully prepared to proceed with surgical treatment although we did discuss alternatives including percutaneous ablation and surveillance. I would plan to have her undergo a right robot-assisted laparoscopic partial nephrectomy.   Cc: Dr. Louis Meckel  Dr. Redmond School      APPENDED NOTES:  I reviewed her renal mass biopsy results. This did confirm renal cell carcinoma. I have notified the patient of her results. She will proceed with a minimally invasive robotic-assisted laparoscopic right partial nephrectomy as discussed.     * Signed by Raynelle Bring, M.D. on 12/24/15 at 8:57 AM (EDT*

## 2016-01-26 NOTE — Anesthesia Preprocedure Evaluation (Addendum)
Anesthesia Evaluation  Patient identified by MRN, date of birth, ID band Patient awake    Reviewed: Allergy & Precautions, NPO status , Patient's Chart, lab work & pertinent test results  History of Anesthesia Complications (+) PONV and history of anesthetic complications  Airway Mallampati: II  TM Distance: <3 FB Neck ROM: Full    Dental  (+) Teeth Intact, Dental Advisory Given,    Pulmonary sleep apnea , former smoker,    breath sounds clear to auscultation       Cardiovascular hypertension,  Rhythm:Regular Rate:Normal     Neuro/Psych Anxiety    GI/Hepatic negative GI ROS, Neg liver ROS,   Endo/Other    Renal/GU Renal diseaseRenal tumor     Musculoskeletal  (+) Arthritis ,   Abdominal   Peds  Hematology negative hematology ROS (+)   Anesthesia Other Findings   Reproductive/Obstetrics                            Anesthesia Physical Anesthesia Plan  ASA: III  Anesthesia Plan: General   Post-op Pain Management:    Induction: Intravenous  Airway Management Planned: Oral ETT  Additional Equipment:   Intra-op Plan:   Post-operative Plan: Extubation in OR  Informed Consent: I have reviewed the patients History and Physical, chart, labs and discussed the procedure including the risks, benefits and alternatives for the proposed anesthesia with the patient or authorized representative who has indicated his/her understanding and acceptance.   Dental advisory given  Plan Discussed with: CRNA  Anesthesia Plan Comments:         Anesthesia Quick Evaluation

## 2016-01-27 ENCOUNTER — Inpatient Hospital Stay (HOSPITAL_COMMUNITY): Payer: Medicare Other | Admitting: Anesthesiology

## 2016-01-27 ENCOUNTER — Encounter (HOSPITAL_COMMUNITY)
Admission: RE | Disposition: A | Discharge status: Home / Self Care | Payer: Self-pay | Source: Ambulatory Visit | Attending: Urology

## 2016-01-27 ENCOUNTER — Inpatient Hospital Stay (HOSPITAL_COMMUNITY)
Admission: RE | Admit: 2016-01-27 | Discharge: 2016-01-29 | DRG: 658 | Disposition: A | Discharge status: Home / Self Care | Payer: Medicare Other | Source: Ambulatory Visit | Attending: Urology | Admitting: Urology

## 2016-01-27 ENCOUNTER — Encounter (HOSPITAL_COMMUNITY): Payer: Self-pay | Admitting: *Deleted

## 2016-01-27 DIAGNOSIS — M1990 Unspecified osteoarthritis, unspecified site: Secondary | ICD-10-CM | POA: Diagnosis present

## 2016-01-27 DIAGNOSIS — I129 Hypertensive chronic kidney disease with stage 1 through stage 4 chronic kidney disease, or unspecified chronic kidney disease: Secondary | ICD-10-CM | POA: Diagnosis not present

## 2016-01-27 DIAGNOSIS — Z8249 Family history of ischemic heart disease and other diseases of the circulatory system: Secondary | ICD-10-CM

## 2016-01-27 DIAGNOSIS — Z823 Family history of stroke: Secondary | ICD-10-CM | POA: Diagnosis not present

## 2016-01-27 DIAGNOSIS — Z79899 Other long term (current) drug therapy: Secondary | ICD-10-CM

## 2016-01-27 DIAGNOSIS — I10 Essential (primary) hypertension: Secondary | ICD-10-CM | POA: Diagnosis not present

## 2016-01-27 DIAGNOSIS — F329 Major depressive disorder, single episode, unspecified: Secondary | ICD-10-CM | POA: Diagnosis present

## 2016-01-27 DIAGNOSIS — E785 Hyperlipidemia, unspecified: Secondary | ICD-10-CM | POA: Diagnosis present

## 2016-01-27 DIAGNOSIS — F411 Generalized anxiety disorder: Secondary | ICD-10-CM | POA: Diagnosis not present

## 2016-01-27 DIAGNOSIS — C641 Malignant neoplasm of right kidney, except renal pelvis: Secondary | ICD-10-CM | POA: Diagnosis not present

## 2016-01-27 DIAGNOSIS — N2889 Other specified disorders of kidney and ureter: Secondary | ICD-10-CM | POA: Diagnosis not present

## 2016-01-27 DIAGNOSIS — G4730 Sleep apnea, unspecified: Secondary | ICD-10-CM | POA: Diagnosis not present

## 2016-01-27 DIAGNOSIS — D49511 Neoplasm of unspecified behavior of right kidney: Secondary | ICD-10-CM | POA: Diagnosis not present

## 2016-01-27 HISTORY — PX: ROBOTIC ASSITED PARTIAL NEPHRECTOMY: SHX6087

## 2016-01-27 LAB — TYPE AND SCREEN
ABO/RH(D): A POS
Antibody Screen: NEGATIVE

## 2016-01-27 LAB — BASIC METABOLIC PANEL
Anion gap: 7 (ref 5–15)
BUN: 17 mg/dL (ref 6–20)
CALCIUM: 8.8 mg/dL — AB (ref 8.9–10.3)
CHLORIDE: 106 mmol/L (ref 101–111)
CO2: 26 mmol/L (ref 22–32)
CREATININE: 0.99 mg/dL (ref 0.44–1.00)
GFR calc non Af Amer: 59 mL/min — ABNORMAL LOW (ref >60)
Glucose, Bld: 128 mg/dL — ABNORMAL HIGH (ref 65–99)
Potassium: 4.1 mmol/L (ref 3.5–5.1)
SODIUM: 139 mmol/L (ref 135–145)

## 2016-01-27 LAB — HEMOGLOBIN AND HEMATOCRIT, BLOOD
HEMATOCRIT: 37.6 % (ref 36.0–46.0)
HEMOGLOBIN: 12.1 g/dL (ref 12.0–15.0)

## 2016-01-27 SURGERY — NEPHRECTOMY, PARTIAL, ROBOT-ASSISTED
Anesthesia: General | Laterality: Right

## 2016-01-27 MED ORDER — CEFAZOLIN SODIUM-DEXTROSE 2-4 GM/100ML-% IV SOLN
INTRAVENOUS | Status: AC
  Filled 2016-01-27: qty 100

## 2016-01-27 MED ORDER — DIPHENHYDRAMINE HCL 50 MG/ML IJ SOLN: 12.5000 mg | Freq: Four times a day (QID) | INTRAMUSCULAR | Status: DC | PRN

## 2016-01-27 MED ORDER — STERILE WATER FOR IRRIGATION IR SOLN
Status: DC | PRN
  Administered 2016-01-27: 1000 mL

## 2016-01-27 MED ORDER — SERTRALINE HCL 50 MG PO TABS
150.0000 mg | ORAL_TABLET | Freq: Every day | ORAL | Status: DC
  Administered 2016-01-28 – 2016-01-29 (×2): 150 mg via ORAL
  Filled 2016-01-27 (×2): qty 3

## 2016-01-27 MED ORDER — SUGAMMADEX SODIUM 200 MG/2ML IV SOLN
INTRAVENOUS | Status: AC
  Filled 2016-01-27: qty 2

## 2016-01-27 MED ORDER — DIPHENHYDRAMINE HCL 12.5 MG/5ML PO ELIX: 12.5000 mg | ORAL_SOLUTION | Freq: Four times a day (QID) | ORAL | Status: DC | PRN

## 2016-01-27 MED ORDER — FENTANYL CITRATE (PF) 100 MCG/2ML IJ SOLN
INTRAMUSCULAR | Status: DC | PRN
  Administered 2016-01-27 (×2): 50 ug via INTRAVENOUS
  Administered 2016-01-27 (×2): 100 ug via INTRAVENOUS

## 2016-01-27 MED ORDER — HYDROCODONE-ACETAMINOPHEN 5-325 MG PO TABS: 1.0000 | ORAL_TABLET | Freq: Four times a day (QID) | ORAL | 0 refills | Status: DC | PRN

## 2016-01-27 MED ORDER — MANNITOL 25 % IV SOLN
25.0000 g | Freq: Once | INTRAVENOUS | Status: AC
  Administered 2016-01-27: 12.5 g via INTRAVENOUS
  Filled 2016-01-27: qty 100

## 2016-01-27 MED ORDER — PHENOL 1.4 % MT LIQD
1.0000 | OROMUCOSAL | Status: DC | PRN
  Filled 2016-01-27: qty 177

## 2016-01-27 MED ORDER — PRAVASTATIN SODIUM 20 MG PO TABS
20.0000 mg | ORAL_TABLET | Freq: Every evening | ORAL | Status: DC
  Administered 2016-01-27 – 2016-01-28 (×2): 20 mg via ORAL
  Filled 2016-01-27 (×2): qty 1

## 2016-01-27 MED ORDER — BUPIVACAINE LIPOSOME 1.3 % IJ SUSP
INTRAMUSCULAR | Status: DC | PRN
  Administered 2016-01-27: 20 mL

## 2016-01-27 MED ORDER — GLYCOPYRROLATE 0.2 MG/ML IV SOSY
PREFILLED_SYRINGE | INTRAVENOUS | Status: DC | PRN
  Administered 2016-01-27 (×2): .2 mg via INTRAVENOUS

## 2016-01-27 MED ORDER — LIDOCAINE 2% (20 MG/ML) 5 ML SYRINGE
INTRAMUSCULAR | Status: DC | PRN
  Administered 2016-01-27: 50 mg via INTRAVENOUS

## 2016-01-27 MED ORDER — PROMETHAZINE HCL 25 MG/ML IJ SOLN: 6.2500 mg | INTRAMUSCULAR | Status: DC | PRN

## 2016-01-27 MED ORDER — ROCURONIUM BROMIDE 10 MG/ML (PF) SYRINGE
PREFILLED_SYRINGE | INTRAVENOUS | Status: DC | PRN
  Administered 2016-01-27: 20 mg via INTRAVENOUS
  Administered 2016-01-27: 50 mg via INTRAVENOUS
  Administered 2016-01-27: 20 mg via INTRAVENOUS

## 2016-01-27 MED ORDER — MORPHINE SULFATE (PF) 2 MG/ML IV SOLN
2.0000 mg | INTRAVENOUS | Status: DC | PRN
  Administered 2016-01-27: 4 mg via INTRAVENOUS
  Administered 2016-01-27 (×2): 2 mg via INTRAVENOUS
  Administered 2016-01-27 – 2016-01-28 (×3): 4 mg via INTRAVENOUS
  Administered 2016-01-28: 2 mg via INTRAVENOUS
  Filled 2016-01-27 (×3): qty 1
  Filled 2016-01-27: qty 2
  Filled 2016-01-27: qty 1
  Filled 2016-01-27: qty 2
  Filled 2016-01-27: qty 1
  Filled 2016-01-27 (×2): qty 2

## 2016-01-27 MED ORDER — PROPOFOL 10 MG/ML IV BOLUS
INTRAVENOUS | Status: DC | PRN
  Administered 2016-01-27: 180 mg via INTRAVENOUS

## 2016-01-27 MED ORDER — MIDAZOLAM HCL 5 MG/5ML IJ SOLN
INTRAMUSCULAR | Status: DC | PRN
  Administered 2016-01-27: 2 mg via INTRAVENOUS

## 2016-01-27 MED ORDER — BUPIVACAINE LIPOSOME 1.3 % IJ SUSP
INTRAMUSCULAR | Status: AC
  Filled 2016-01-27: qty 20

## 2016-01-27 MED ORDER — SODIUM CHLORIDE 0.9 % IJ SOLN
INTRAMUSCULAR | Status: AC
  Filled 2016-01-27: qty 20

## 2016-01-27 MED ORDER — MIDAZOLAM HCL 2 MG/2ML IJ SOLN
INTRAMUSCULAR | Status: AC
  Filled 2016-01-27: qty 2

## 2016-01-27 MED ORDER — SUGAMMADEX SODIUM 200 MG/2ML IV SOLN
INTRAVENOUS | Status: DC | PRN
  Administered 2016-01-27: 200 mg via INTRAVENOUS

## 2016-01-27 MED ORDER — HYDROMORPHONE HCL 1 MG/ML IJ SOLN
INTRAMUSCULAR | Status: AC
  Filled 2016-01-27: qty 1

## 2016-01-27 MED ORDER — DOCUSATE SODIUM 100 MG PO CAPS
100.0000 mg | ORAL_CAPSULE | Freq: Two times a day (BID) | ORAL | Status: DC
  Administered 2016-01-27 – 2016-01-29 (×4): 100 mg via ORAL
  Filled 2016-01-27 (×4): qty 1

## 2016-01-27 MED ORDER — MENTHOL 3 MG MT LOZG
1.0000 | LOZENGE | OROMUCOSAL | Status: DC | PRN
  Filled 2016-01-27: qty 9

## 2016-01-27 MED ORDER — ZOLPIDEM TARTRATE 5 MG PO TABS: 5.0000 mg | ORAL_TABLET | Freq: Every evening | ORAL | Status: DC | PRN

## 2016-01-27 MED ORDER — HYDROMORPHONE HCL 1 MG/ML IJ SOLN
0.2500 mg | INTRAMUSCULAR | Status: DC | PRN
  Administered 2016-01-27 (×4): 0.5 mg via INTRAVENOUS

## 2016-01-27 MED ORDER — FENTANYL CITRATE (PF) 100 MCG/2ML IJ SOLN
INTRAMUSCULAR | Status: AC
  Filled 2016-01-27: qty 4

## 2016-01-27 MED ORDER — ACETAMINOPHEN 10 MG/ML IV SOLN
1000.0000 mg | Freq: Four times a day (QID) | INTRAVENOUS | Status: DC
  Administered 2016-01-27 – 2016-01-28 (×3): 1000 mg via INTRAVENOUS
  Filled 2016-01-27 (×5): qty 100

## 2016-01-27 MED ORDER — LACTATED RINGERS IV SOLN
INTRAVENOUS | Status: DC | PRN
  Administered 2016-01-27 (×2): via INTRAVENOUS

## 2016-01-27 MED ORDER — CEFAZOLIN IN D5W 1 GM/50ML IV SOLN
1.0000 g | Freq: Three times a day (TID) | INTRAVENOUS | Status: AC
  Administered 2016-01-27 (×2): 1 g via INTRAVENOUS
  Filled 2016-01-27 (×2): qty 50

## 2016-01-27 MED ORDER — SODIUM CHLORIDE 0.9 % IJ SOLN
INTRAMUSCULAR | Status: AC
  Filled 2016-01-27: qty 10

## 2016-01-27 MED ORDER — CLONAZEPAM 0.5 MG PO TABS
0.5000 mg | ORAL_TABLET | Freq: Two times a day (BID) | ORAL | Status: DC
  Administered 2016-01-28 – 2016-01-29 (×3): 0.5 mg via ORAL
  Filled 2016-01-27 (×3): qty 1

## 2016-01-27 MED ORDER — ONDANSETRON HCL 4 MG/2ML IJ SOLN
INTRAMUSCULAR | Status: AC
  Filled 2016-01-27: qty 2

## 2016-01-27 MED ORDER — MANNITOL 25 % IV SOLN
INTRAVENOUS | Status: AC
  Filled 2016-01-27: qty 50

## 2016-01-27 MED ORDER — CEFAZOLIN SODIUM-DEXTROSE 2-3 GM-% IV SOLR
INTRAVENOUS | Status: DC | PRN
  Administered 2016-01-27: 2 g via INTRAVENOUS

## 2016-01-27 MED ORDER — SODIUM CHLORIDE 0.9 % IJ SOLN
INTRAMUSCULAR | Status: DC | PRN
  Administered 2016-01-27: 20 mL

## 2016-01-27 MED ORDER — ROCURONIUM BROMIDE 10 MG/ML (PF) SYRINGE
PREFILLED_SYRINGE | INTRAVENOUS | Status: AC
  Filled 2016-01-27: qty 10

## 2016-01-27 MED ORDER — PROPOFOL 10 MG/ML IV BOLUS
INTRAVENOUS | Status: AC
  Filled 2016-01-27: qty 40

## 2016-01-27 MED ORDER — LACTATED RINGERS IR SOLN
Status: DC | PRN
  Administered 2016-01-27: 1000 mL

## 2016-01-27 MED ORDER — FENTANYL CITRATE (PF) 100 MCG/2ML IJ SOLN
INTRAMUSCULAR | Status: AC
  Filled 2016-01-27: qty 2

## 2016-01-27 MED ORDER — ONDANSETRON HCL 4 MG/2ML IJ SOLN
4.0000 mg | INTRAMUSCULAR | Status: DC | PRN
  Administered 2016-01-27 – 2016-01-28 (×3): 4 mg via INTRAVENOUS
  Filled 2016-01-27 (×3): qty 2

## 2016-01-27 MED ORDER — DEXTROSE-NACL 5-0.45 % IV SOLN
INTRAVENOUS | Status: DC
  Administered 2016-01-27 – 2016-01-28 (×5): via INTRAVENOUS

## 2016-01-27 SURGICAL SUPPLY — 52 items
APL ESCP 34 STRL LF DISP (HEMOSTASIS) ×1
APPLICATOR SURGIFLO ENDO (HEMOSTASIS) ×1 IMPLANT
BAG SPEC RTRVL LRG 6X4 10 (ENDOMECHANICALS) ×1
CHLORAPREP W/TINT 26ML (MISCELLANEOUS) ×2 IMPLANT
CLIP LIGATING HEM O LOK PURPLE (MISCELLANEOUS) ×2 IMPLANT
CLIP LIGATING HEMO O LOK GREEN (MISCELLANEOUS) ×4 IMPLANT
COVER SURGICAL LIGHT HANDLE (MISCELLANEOUS) ×2 IMPLANT
COVER TIP SHEARS 8 DVNC (MISCELLANEOUS) ×1 IMPLANT
COVER TIP SHEARS 8MM DA VINCI (MISCELLANEOUS) ×1
DECANTER SPIKE VIAL GLASS SM (MISCELLANEOUS) ×2 IMPLANT
DRAIN CHANNEL 15F RND FF 3/16 (WOUND CARE) ×2 IMPLANT
DRAPE ARM DVNC X/XI (DISPOSABLE) ×4 IMPLANT
DRAPE COLUMN DVNC XI (DISPOSABLE) ×1 IMPLANT
DRAPE DA VINCI XI ARM (DISPOSABLE) ×4
DRAPE DA VINCI XI COLUMN (DISPOSABLE) ×1
DRAPE INCISE IOBAN 66X45 STRL (DRAPES) ×2 IMPLANT
DRAPE SHEET LG 3/4 BI-LAMINATE (DRAPES) ×2 IMPLANT
ELECT PENCIL ROCKER SW 15FT (MISCELLANEOUS) ×2 IMPLANT
ELECT REM PT RETURN 9FT ADLT (ELECTROSURGICAL) ×2
ELECTRODE REM PT RTRN 9FT ADLT (ELECTROSURGICAL) ×1 IMPLANT
EVACUATOR SILICONE 100CC (DRAIN) ×2 IMPLANT
GLOVE BIO SURGEON STRL SZ 6.5 (GLOVE) ×2 IMPLANT
GLOVE BIOGEL M STRL SZ7.5 (GLOVE) ×4 IMPLANT
GOWN STRL REUS W/TWL LRG LVL3 (GOWN DISPOSABLE) ×7 IMPLANT
HEMOSTAT SURGICEL 4X8 (HEMOSTASIS) ×1 IMPLANT
IRRIG SUCT STRYKERFLOW 2 WTIP (MISCELLANEOUS) ×2
IRRIGATION SUCT STRKRFLW 2 WTP (MISCELLANEOUS) ×1 IMPLANT
KIT BASIN OR (CUSTOM PROCEDURE TRAY) ×2 IMPLANT
LIQUID BAND (GAUZE/BANDAGES/DRESSINGS) ×4 IMPLANT
POSITIONER SURGICAL ARM (MISCELLANEOUS) ×4 IMPLANT
POUCH SPECIMEN RETRIEVAL 10MM (ENDOMECHANICALS) ×2 IMPLANT
SEAL CANN UNIV 5-8 DVNC XI (MISCELLANEOUS) ×4 IMPLANT
SEAL XI 5MM-8MM UNIVERSAL (MISCELLANEOUS) ×4
SOLUTION ELECTROLUBE (MISCELLANEOUS) ×2 IMPLANT
SURGIFLO W/THROMBIN 8M KIT (HEMOSTASIS) ×2 IMPLANT
SUT ETHILON 3 0 PS 1 (SUTURE) ×2 IMPLANT
SUT MNCRL AB 4-0 PS2 18 (SUTURE) ×4 IMPLANT
SUT V-LOC BARB 180 2/0GR6 GS22 (SUTURE) ×2
SUT VIC AB 0 CT1 27 (SUTURE) ×2
SUT VIC AB 0 CT1 27XBRD ANTBC (SUTURE) ×1 IMPLANT
SUT VICRYL 0 UR6 27IN ABS (SUTURE) ×4 IMPLANT
SUT VLOC BARB 180 ABS3/0GR12 (SUTURE) ×4
SUTURE V-LC BRB 180 2/0GR6GS22 (SUTURE) ×1 IMPLANT
SUTURE VLOC BRB 180 ABS3/0GR12 (SUTURE) ×1 IMPLANT
TAPE STRIPS DRAPE STRL (GAUZE/BANDAGES/DRESSINGS) ×2 IMPLANT
TOWEL OR 17X26 10 PK STRL BLUE (TOWEL DISPOSABLE) ×4 IMPLANT
TRAY FOLEY W/METER SILVER 16FR (SET/KITS/TRAYS/PACK) ×2 IMPLANT
TRAY LAPAROSCOPIC (CUSTOM PROCEDURE TRAY) ×2 IMPLANT
TROCAR BLADELESS OPT 5 100 (ENDOMECHANICALS) ×2 IMPLANT
TROCAR XCEL 12X100 BLDLESS (ENDOMECHANICALS) ×2 IMPLANT
TUBING INSUFFLATION 10FT LAP (TUBING) IMPLANT
WATER STERILE IRR 1500ML POUR (IV SOLUTION) ×4 IMPLANT

## 2016-01-27 NOTE — Anesthesia Procedure Notes (Signed)
Procedure Name: Intubation Performed by: Gean Maidens Pre-anesthesia Checklist: Patient identified, Emergency Drugs available, Suction available, Patient being monitored and Timeout performed Patient Re-evaluated:Patient Re-evaluated prior to inductionOxygen Delivery Method: Circle system utilized Preoxygenation: Pre-oxygenation with 100% oxygen Intubation Type: IV induction Ventilation: Mask ventilation without difficulty Laryngoscope Size: Mac and 3 Grade View: Grade II Tube size: 7.0 mm Number of attempts: 1 Airway Equipment and Method: Stylet Placement Confirmation: ETT inserted through vocal cords under direct vision,  positive ETCO2,  CO2 detector and breath sounds checked- equal and bilateral Secured at: 21 cm Tube secured with: Tape Dental Injury: Teeth and Oropharynx as per pre-operative assessment

## 2016-01-27 NOTE — Interval H&P Note (Signed)
History and Physical Interval Note:  01/27/2016 6:53 AM  [name]  has presented today for surgery, with the diagnosis of RIGHT RENAL NEOPLASM  The various methods of treatment have been discussed with the patient and family. After consideration of risks, benefits and other options for treatment, the patient has consented to  Procedure(s): XI ROBOTIC ASSITED PARTIAL NEPHRECTOMY (Right) as a surgical intervention .  The patient's history has been reviewed, patient examined, no change in status, stable for surgery.  I have reviewed the patient's chart and labs.  Questions were answered to the patient's satisfaction.     Shasta Chinn,LES

## 2016-01-27 NOTE — Anesthesia Postprocedure Evaluation (Signed)
Anesthesia Post Note  Patient: [name]  Procedure(s) Performed: Procedure(s) (LRB): XI ROBOTIC ASSITED PARTIAL NEPHRECTOMY (Right)  Patient location during evaluation: PACU Anesthesia Type: General Level of consciousness: awake and alert Pain management: pain level controlled Vital Signs Assessment: post-procedure vital signs reviewed and stable Respiratory status: spontaneous breathing, nonlabored ventilation, respiratory function stable and patient connected to nasal cannula oxygen Cardiovascular status: blood pressure returned to baseline and stable Postop Assessment: no signs of nausea or vomiting Anesthetic complications: no    Last Vitals:  Vitals:   01/27/16 1200 01/27/16 1630  BP: (!) 148/69 107/63  Pulse: 83 63  Resp: 16 17  Temp: 36.5 C 36.3 C    Last Pain:  Vitals:   01/27/16 1630  TempSrc: Oral  PainSc:                  Effie Berkshire

## 2016-01-27 NOTE — Discharge Instructions (Signed)

## 2016-01-27 NOTE — Progress Notes (Signed)
Post-op note  Subjective: The patient is doing well.  No complaints except mild nausea and some abd discomfort under the left rib cage  Objective: Vital signs in last 24 hours: Temp:  [97.7 F (36.5 C)-97.9 F (36.6 C)] 97.7 F (36.5 C) (09/25 1200) Pulse Rate:  [71-83] 83 (09/25 1200) Resp:  [9-18] 16 (09/25 1200) BP: (129-152)/(69-87) 148/69 (09/25 1200) SpO2:  [91 %-100 %] 96 % (09/25 1200) Weight:  [88.5 kg (195 lb)] 88.5 kg (195 lb) (09/25 0607)  Intake/Output from previous day: No intake/output data recorded. Intake/Output this shift: Total I/O In: 1850 [I.V.:1850] Out: 405 [Urine:275; Drains:30; Blood:100]  Physical Exam:  General: Alert and oriented. Abdomen: Soft, Nondistended. No masses; NT to palp in LUQ Incisions: Clean and dry. Urine: clear  Lab Results:  Recent Labs  01/27/16 1142  HGB 12.1  HCT 37.6    Assessment/Plan: POD#0   1) Continue to monitor  2) DVT prophy, clears, IS, bed rest tonight, pain control   LOS: 0 days   [name] 01/27/2016, 1:51 PM

## 2016-01-27 NOTE — Op Note (Signed)
Preoperative diagnosis: Right renal cell carcinoma  Postoperative diagnosis: Right renal cell carcinoma  Procedure:  1. Right robotic-assisted laparoscopic partial nephrectomy 2. Intraoperative renal ultrasonography  Surgeon: Pryor Curia. M.D.  Assistant(s): Debbrah Alar, PA-C  An assistant was required for this surgical procedure.  The duties of the assistant included but were not limited to suctioning, passing suture, camera manipulation, retraction. This procedure would not be able to be performed without an Environmental consultant.  Anesthesia: General  Complications: None  EBL: 100 mL  IVF:  1500 mL crystalloid  Specimens: 1. Right renal mass  Disposition of specimens: Pathology  Intraoperative findings:       1. Warm renal ischemia time: 21 minutes       2. Intraoperative renal ultrasound findings: There was noted to be a heterogenous mass measuring 2.8 x 2.8 cm in the lower pole of the right kidney.  Drains: 1. # 15 Blake perinephric drain  Indication:  [name] is a 65 y.o. year old patient with a right renal mass confirmed to be renal cell carcinoma on biopsy.  After a thorough review of the management options for their renal mass, they elected to proceed with surgical treatment and the above procedure.  We have discussed the potential benefits and risks of the procedure, side effects of the proposed treatment, the likelihood of the patient achieving the goals of the procedure, and any potential problems that might occur during the procedure or recuperation. Informed consent has been obtained.   Description of procedure:  The patient was taken to the operating room and a general anesthetic was administered. The patient was given preoperative antibiotics, placed in the right modified flank position with care to pad all potential pressure points, and prepped and draped in the usual sterile fashion. Next a preoperative timeout was performed.  A site was selected  on in the midline for placement of the assistant port. This was placed using a standard open Hassan technique which allowed entry into the peritoneal cavity under direct vision and without difficulty. A 12 mm port was placed and a pneumoperitoneum established. The camera was then used to inspect the abdomen and there was no evidence of any intra-abdominal injuries or other abnormalities. The remaining abdominal ports were then placed. 8 mm robotic ports were placed in the right upper quadrant, right lower quadrant, and far right lateral abdominal wall. An 8 mm port was placed for the camera site just right of the umbilicus. All ports were placed under direct vision without difficulty. The surgical cart was then docked.   Utilizing the cautery scissors, the white line of Toldt was incised allowing the colon to be mobilized medially and the plane between the mesocolon and the anterior layer of Gerota's fascia to be developed and the kidney to be exposed.  The ureter and gonadal vein were identified inferiorly and the ureter was lifted anteriorly off the psoas muscle.  Dissection proceeded superiorly along the gonadal vein until the renal vein was identified.  The renal hilum was then carefully isolated with a combination of blunt and sharp dissection allowing the renal arterial and venous structures to be separated and isolated in preparation for renal hilar vessel clamping. There was a branching main renal artery and a second smaller renal artery.  There was a main renal vein and a smaller lower pole renal vein.  12.5 g of IV mannitol was then administered.   Attention turned to the kidney and the perinephric fat surrounding the renal mass was  removed and the kidney was mobilized sufficiently for exposure and resection of the renal mass.   Intraoperative renal ultrasonography was utilized with the drop-in ultrasound probe to identify the renal tumor and identify the tumor margins. The tumor was not able to be  well visualized grossly as it was mostly endophytic on imaging.  The ultrasound allowed easy identification of a heterogenous, ball-shaped mass measuring 2.8 x 2.8 cm in the lower pole.  The margins of the tumor were marked on the renal capsule with ultrasound guidance.   Once the renal mass was properly isolated, preparations were made for resection of the tumor.  Reconstructive sutures were placed into the abdomen for the renorrhaphy portion of the procedure.  The two renal arteries were then clamped with bulldog clamps.  The tumor was then excised with cold scissor dissection along with an adequate visible gross margin of normal renal parenchyma. The tumor appeared to be excised without any gross violation of the tumor. The renal collecting system was entered during removal of the tumor.  A running 3-0 V-lock suture was then brought through the capsule of the kidney and run along the base of the renal defect to provide hemostasis and close any entry into the renal collecting system if present. Weck clips were used to secure this suture outside the renal capsule at the proximal and distal ends. An additional hemostatic agent (Surgiflo) was then placed into the renal defect. A running 2-0 V lock suture was then used to close the capsule of the kidney using a sliding clip technique which resulted in excellent hemostasis.    The bulldog clamps were then removed from the renal hilar vessel(s) and an additional 12.5 g of IV mannitol was administered. Total warm renal ischemia time was 21 minutes. The renal tumor resection site was examined. Hemostasis appeared adequate.   The kidney was placed back into its normal anatomic position and covered with perinephric fat as needed.  A # 8 Blake drain was then brought through the lateral lower port site and positioned in the perinephric space.  It was secured to the skin with a nylon suture. The surgical cart was undocked.  The renal tumor specimen was removed intact  within an endopouch retrieval bag via the upper midline port site. This incision site was closed at the fascial layer with 0-vicryl suture. All other laparoscopic/robotic ports were removed under direct vision and the pneumoperitoneum let down with inspection of the operative field performed and hemostasis again confirmed. All incision sites were then injected with local anesthetic and reapproximated at the skin level with 4-0 monocryl subcuticular closures.  Liquiband was applied to the skin.  The patient tolerated the procedure well and without complications.  The patient was able to be extubated and transferred to the recovery unit in satisfactory condition.  Pryor Curia MD

## 2016-01-27 NOTE — Transfer of Care (Signed)
Immediate Anesthesia Transfer of Care Note  Patient: [name]  Procedure(s) Performed: Procedure(s): XI ROBOTIC ASSITED PARTIAL NEPHRECTOMY (Right)  Patient Location: PACU  Anesthesia Type:General  Level of Consciousness: sedated  Airway & Oxygen Therapy: Patient Spontanous Breathing and Patient connected to face mask oxygen  Post-op Assessment: Report given to RN and Post -op Vital signs reviewed and stable  Post vital signs: Reviewed and stable  Last Vitals:  Vitals:   01/27/16 0546  BP: (!) 149/70  Pulse: 76  Resp: 18  Temp: 36.6 C    Last Pain:  Vitals:   01/27/16 0546  TempSrc: Oral      Patients Stated Pain Goal: 4 ([id number])  Complications: No apparent anesthesia complications

## 2016-01-28 LAB — BASIC METABOLIC PANEL
ANION GAP: 6 (ref 5–15)
BUN: 11 mg/dL (ref 6–20)
CALCIUM: 8.2 mg/dL — AB (ref 8.9–10.3)
CO2: 25 mmol/L (ref 22–32)
CREATININE: 1.08 mg/dL — AB (ref 0.44–1.00)
Chloride: 103 mmol/L (ref 101–111)
GFR, EST NON AFRICAN AMERICAN: 53 mL/min — AB (ref >60)
Glucose, Bld: 146 mg/dL — ABNORMAL HIGH (ref 65–99)
Potassium: 3.5 mmol/L (ref 3.5–5.1)
Sodium: 134 mmol/L — ABNORMAL LOW (ref 135–145)

## 2016-01-28 LAB — CREATININE, FLUID (PLEURAL, PERITONEAL, JP DRAINAGE): Creat, Fluid: 1.2 mg/dL

## 2016-01-28 LAB — HEMOGLOBIN AND HEMATOCRIT, BLOOD
HEMATOCRIT: 34.8 % — AB (ref 36.0–46.0)
HEMOGLOBIN: 11.4 g/dL — AB (ref 12.0–15.0)

## 2016-01-28 MED ORDER — HYDROCODONE-ACETAMINOPHEN 5-325 MG PO TABS
1.0000 | ORAL_TABLET | Freq: Four times a day (QID) | ORAL | Status: DC | PRN
  Administered 2016-01-28 – 2016-01-29 (×4): 2 via ORAL
  Administered 2016-01-29: 1 via ORAL
  Filled 2016-01-28 (×2): qty 2
  Filled 2016-01-28: qty 1
  Filled 2016-01-28 (×2): qty 2

## 2016-01-28 MED ORDER — BISACODYL 10 MG RE SUPP
10.0000 mg | Freq: Once | RECTAL | Status: AC
  Administered 2016-01-28: 10 mg via RECTAL
  Filled 2016-01-28: qty 1

## 2016-01-28 MED ORDER — BISACODYL 10 MG RE SUPP
10.0000 mg | Freq: Every day | RECTAL | Status: DC | PRN
  Administered 2016-01-28: 10 mg via RECTAL
  Filled 2016-01-28: qty 1

## 2016-01-28 MED ORDER — METOCLOPRAMIDE HCL 5 MG/ML IJ SOLN
5.0000 mg | Freq: Four times a day (QID) | INTRAMUSCULAR | Status: DC
  Administered 2016-01-28 – 2016-01-29 (×4): 5 mg via INTRAVENOUS
  Filled 2016-01-28 (×4): qty 2

## 2016-01-28 MED ORDER — ALUM & MAG HYDROXIDE-SIMETH 200-200-20 MG/5ML PO SUSP
15.0000 mL | Freq: Four times a day (QID) | ORAL | Status: DC | PRN
  Administered 2016-01-28 – 2016-01-29 (×2): 15 mL via ORAL
  Filled 2016-01-28 (×2): qty 30

## 2016-01-28 MED ORDER — ALUM & MAG HYDROXIDE-SIMETH 200-200-20 MG/5ML PO SUSP
30.0000 mL | Freq: Once | ORAL | Status: AC
  Administered 2016-01-28: 30 mL via ORAL
  Filled 2016-01-28: qty 30

## 2016-01-28 NOTE — Care Management Note (Signed)
Case Management Note  Patient Details  Name: [name] MRN: [id number] Date of Birth: [birth year]  Subjective/Objective:  65 y/o f admitted w/Renal Cell Ca. S/p Robotic lap partial nephrectomy. From home.                  Action/Plan:d/c plan home.   Expected Discharge Date:                  Expected Discharge Plan:  Home/Self Care  In-House Referral:     Discharge planning Services  CM Consult  Post Acute Care Choice:    Choice offered to:     DME Arranged:    DME Agency:     HH Arranged:    HH Agency:     Status of Service:  In process, will continue to follow  If discussed at Long Length of Stay Meetings, dates discussed:    Additional Comments:  Dessa Phi, RN 01/28/2016, 3:54 PM

## 2016-01-28 NOTE — Progress Notes (Signed)
Offered to assist pt with her home cpap, but she declined.  Pt feels that she has air in her stomach and is concerned that the cpap may make it worse.  Pt was advised that RT is available all night should she change her mind and need further assistance.

## 2016-01-28 NOTE — Progress Notes (Signed)
Patient ID: [name], female   DOB: 1950-06-05, 65 y.o.   MRN: [id number]  1 Day Post-Op Subjective: Pt with pain controlled overnight.  No nausea or vomiting.  No flatus.  Objective: Vital signs in last 24 hours: Temp:  [97.4 F (36.3 C)-98.3 F (36.8 C)] 98 F (36.7 C) (09/26 0551) Pulse Rate:  [63-83] 74 (09/26 0551) Resp:  [9-22] 22 (09/26 0551) BP: (107-152)/(60-87) 122/60 (09/26 0551) SpO2:  [91 %-100 %] 100 % (09/26 0551)  Intake/Output from previous day: 09/25 0701 - 09/26 0700 In: 5592.5 [P.O.:720; I.V.:4522.5; IV Piggyback:350] Out: 1405 [Urine:1225; Drains:80; Blood:100] Intake/Output this shift: No intake/output data recorded.  Physical Exam:  General: Alert and oriented CV: RRR Lungs: Clear Abdomen: Soft, ND, No bowel sounds Incisions: C/D/I Ext: NT, No erythema  Lab Results:  Recent Labs  01/27/16 1142 01/28/16 0528  HGB 12.1 11.4*  HCT 37.6 34.8*   BMET  Recent Labs  01/27/16 1142 01/28/16 0528  NA 139 134*  K 4.1 3.5  CL 106 103  CO2 26 25  GLUCOSE 128* 146*  BUN 17 11  CREATININE 0.99 1.08*  CALCIUM 8.8* 8.2*     Studies/Results: Pathology pending  Assessment/Plan: POD # 1 s/p right RAL partial nephrectomy - D/C Foley - Ambulate, IS - Advance diet - Dulcolax suppository - Po pain medication - Monitor renal function, drain output   LOS: 1 day   [name],LES 01/28/2016, 7:47 AM

## 2016-01-29 LAB — BASIC METABOLIC PANEL
Anion gap: 6 (ref 5–15)
BUN: 9 mg/dL (ref 6–20)
CHLORIDE: 104 mmol/L (ref 101–111)
CO2: 26 mmol/L (ref 22–32)
Calcium: 8.6 mg/dL — ABNORMAL LOW (ref 8.9–10.3)
Creatinine, Ser: 1.09 mg/dL — ABNORMAL HIGH (ref 0.44–1.00)
GFR calc Af Amer: >60 mL/min (ref >60)
GFR calc non Af Amer: 52 mL/min — ABNORMAL LOW (ref >60)
Glucose, Bld: 134 mg/dL — ABNORMAL HIGH (ref 65–99)
POTASSIUM: 4.6 mmol/L (ref 3.5–5.1)
SODIUM: 136 mmol/L (ref 135–145)

## 2016-01-29 LAB — GLUCOSE, CAPILLARY: GLUCOSE-CAPILLARY: 155 mg/dL — AB (ref 65–99)

## 2016-01-29 MED ORDER — ACETAMINOPHEN 325 MG PO TABS
650.0000 mg | ORAL_TABLET | Freq: Four times a day (QID) | ORAL | Status: DC | PRN
  Administered 2016-01-29: 650 mg via ORAL
  Filled 2016-01-29: qty 2

## 2016-01-29 NOTE — Progress Notes (Signed)
Patient ID: [name], female   DOB: October 28, 1950, 65 y.o.   MRN: [id number]  2 Days Post-Op Subjective: Pt doing well. Tolerated full liquids.  Passing flatus.  Pain controlled.  Fever to 101 this morning now improved.  Objective: Vital signs in last 24 hours: Temp:  [98.5 F (36.9 C)-101.3 F (38.5 C)] 99.2 F (37.3 C) (09/27 0627) Pulse Rate:  [68-88] 88 (09/27 0515) Resp:  [20-21] 20 (09/27 0515) BP: (109-142)/(56-68) 142/68 (09/27 0515) SpO2:  [92 %-96 %] 92 % (09/27 0515)  Intake/Output from previous day: 09/26 0701 - 09/27 0700 In: 2484 [P.O.:480; I.V.:1990] Out: 2045 [Urine:2025; Drains:20] Intake/Output this shift: Total I/O In: 906.5 [I.V.:892.5; Other:14] Out: 800 [Urine:800]  Physical Exam:  General: Alert and oriented Abdomen: Soft, ND Incisions: C/D/I Ext: NT, No erythema  Lab Results:  Recent Labs  01/27/16 1142 01/28/16 0528  HGB 12.1 11.4*  HCT 37.6 34.8*   BMET  Recent Labs  01/28/16 0528 01/29/16 0540  NA 134* 136  K 3.5 4.6  CL 103 104  CO2 25 26  GLUCOSE 146* 134*  BUN 11 9  CREATININE 1.08* 1.09*  CALCIUM 8.2* 8.6*     Studies/Results: Path: pT1a Nx Mx, 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

## 2016-01-31 DIAGNOSIS — R1032 Left lower quadrant pain: Secondary | ICD-10-CM | POA: Diagnosis not present

## 2016-02-06 DIAGNOSIS — R109 Unspecified abdominal pain: Secondary | ICD-10-CM | POA: Diagnosis not present

## 2016-02-06 DIAGNOSIS — Z6830 Body mass index (BMI) 30.0-30.9, adult: Secondary | ICD-10-CM | POA: Diagnosis not present

## 2016-02-06 DIAGNOSIS — E119 Type 2 diabetes mellitus without complications: Secondary | ICD-10-CM | POA: Diagnosis not present

## 2016-02-06 DIAGNOSIS — I10 Essential (primary) hypertension: Secondary | ICD-10-CM | POA: Diagnosis not present

## 2016-02-06 NOTE — Discharge Summary (Addendum)
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 year old patient with [name] is an extremely anxious 65 year old female seen today at the request of Dr. Burman Nieves for a 3.1 cm right lower pole renal mass. He has a long-standing history of generalized anxiety and in particular has anxiety about her kidneys and kidney function stemming from an incident when she underwent a hysterectomy in 2002. She developed an acute kidney injury possibly related to severe dehydration following a bowel prep and possibly compounded by intraoperative blood loss. She apparently saw a nephrologist for a couple of years and had complete resolution of her acute kidney injury but continues to be extremely concerned and anxious about it.   She underwent a CT scan of the abdomen and pelvis without contrast in mid June due to complaints of right-sided flank pain and a family history of kidney stones. This fortunately did not demonstrate any kidney stones but did demonstrate a poorly characterized lesion in the lower pole of the right kidney. This prompted a CT scan of the abdomen and pelvis with contrast on 10/17/15 they confirmed a 3.1 cm low density lesion that did demonstrate enhancement consistent with a possible renal cell carcinoma. There did appear to be central necrosis of this lesion. She was seen by Dr. Louis Meckel and underwent a consultation. Due to her extreme anxiety, she requested a second opinion and is seen today for that reason.   She has no family history of kidney cancer. She denies hematuria. She denies unintentional weight loss, night sweats, or other complaints. She does continue to have periodic right-sided flank and upper  abdominal pain that has been intermittent.   Hospital Course: Pt was admitted and taken to the OR on 01/27/16 for a right robotic assisted laparoscopic partial nephrectomy.  Pt tolerated the procedure well and was hemodynamically stable immediately post op. She was extubated without complication and woke up from anesthesia neurologically intact.  She was then transferred from the OR to PACU and then to the floor without difficulty.  Her post op course progressed as expected. Her foley and JP drain were removed without difficulty and she was able to void.  Her diet was advanced accordingly and she was ambulating without aid.  She did have a LGF on POD 2 which resolved with ambulation and incentive spirometry.  She was passing flatus but had not had a BM at the time of d/c.  Pt was doing well and felt stable for d/c home on POD 2.     Laboratory values:  Lab Results  Component Value Date   CREATININE 1.09 (H) 01/29/2016   Lab Results  Component Value Date   WBC 7.7 01/22/2016   HGB 11.4 (L) 01/28/2016   HCT 34.8 (L) 01/28/2016   MCV 89.4 01/22/2016   PLT 217 01/22/2016     Disposition: Home   Discharge instruction: The patient was instructed to be ambulatory but told to refrain from heavy lifting, strenuous activity, or driving.  Discharge medications:    Medication List    STOP taking these medications   Cinnamon 500 MG Tabs   CoQ10 100 MG Caps   fish oil-omega-3 fatty acids 1000 MG capsule   multivitamin with minerals tablet  TAKE these medications   acetaminophen 500 MG tablet Commonly known as:  TYLENOL Take 1,000 mg by mouth every 6 (six) hours as needed for mild pain.   clonazePAM 0.5 MG tablet Commonly known as:  KLONOPIN Take 0.5 mg by mouth 2 (two) times daily.   HYDROcodone-acetaminophen 5-325 MG tablet Commonly known as:  NORCO/VICODIN Take 1-2 tablets by mouth every 6 (six) hours as needed for moderate pain or severe pain. What changed:  how much to  take  when to take this  Another medication with the same name was removed. Continue taking this medication, and follow the directions you see here. Notes to patient:  Last dose 01/29/16 @ 1156am 1 tablet   pravastatin 40 MG tablet Commonly known as:  PRAVACHOL Take 20 mg by mouth every evening.   sertraline 100 MG tablet Commonly known as:  ZOLOFT Take 150 mg by mouth daily. Takes 1.5 tablets daily   valsartan 80 MG tablet Commonly known as:  DIOVAN Take 80 mg by mouth daily.   zolpidem 10 MG tablet Commonly known as:  AMBIEN Take 5 mg by mouth at bedtime as needed for sleep.       Followup:  Follow-up Information    Meriah Shands,LES, MD On 02/18/2016.   Specialty:  Urology Why:  at 1:30 Contact information: [place] [address] [phone]

## 2016-02-12 DIAGNOSIS — F411 Generalized anxiety disorder: Secondary | ICD-10-CM | POA: Diagnosis not present

## 2016-02-18 DIAGNOSIS — C641 Malignant neoplasm of right kidney, except renal pelvis: Secondary | ICD-10-CM | POA: Diagnosis not present

## 2016-03-11 DIAGNOSIS — F411 Generalized anxiety disorder: Secondary | ICD-10-CM | POA: Diagnosis not present

## 2016-03-17 DIAGNOSIS — Z23 Encounter for immunization: Secondary | ICD-10-CM | POA: Diagnosis not present

## 2016-04-29 ENCOUNTER — Ambulatory Visit (INDEPENDENT_AMBULATORY_CARE_PROVIDER_SITE_OTHER): Payer: Medicare Other | Admitting: Psychology

## 2016-04-29 DIAGNOSIS — F410 Panic disorder [episodic paroxysmal anxiety] without agoraphobia: Secondary | ICD-10-CM | POA: Diagnosis not present

## 2016-05-12 ENCOUNTER — Ambulatory Visit (INDEPENDENT_AMBULATORY_CARE_PROVIDER_SITE_OTHER): Payer: Medicare Other | Admitting: Psychology

## 2016-05-12 DIAGNOSIS — F4323 Adjustment disorder with mixed anxiety and depressed mood: Secondary | ICD-10-CM

## 2016-05-12 DIAGNOSIS — F410 Panic disorder [episodic paroxysmal anxiety] without agoraphobia: Secondary | ICD-10-CM | POA: Diagnosis not present

## 2016-05-13 DIAGNOSIS — F411 Generalized anxiety disorder: Secondary | ICD-10-CM | POA: Diagnosis not present

## 2016-05-18 ENCOUNTER — Ambulatory Visit (INDEPENDENT_AMBULATORY_CARE_PROVIDER_SITE_OTHER): Payer: Medicare Other | Admitting: Psychology

## 2016-05-18 DIAGNOSIS — F410 Panic disorder [episodic paroxysmal anxiety] without agoraphobia: Secondary | ICD-10-CM

## 2016-05-18 DIAGNOSIS — F4323 Adjustment disorder with mixed anxiety and depressed mood: Secondary | ICD-10-CM | POA: Diagnosis not present

## 2016-05-28 ENCOUNTER — Ambulatory Visit (INDEPENDENT_AMBULATORY_CARE_PROVIDER_SITE_OTHER): Payer: Medicare Other | Admitting: Psychology

## 2016-05-28 DIAGNOSIS — F411 Generalized anxiety disorder: Secondary | ICD-10-CM | POA: Diagnosis not present

## 2016-05-28 DIAGNOSIS — F4323 Adjustment disorder with mixed anxiety and depressed mood: Secondary | ICD-10-CM | POA: Diagnosis not present

## 2016-06-03 ENCOUNTER — Ambulatory Visit (INDEPENDENT_AMBULATORY_CARE_PROVIDER_SITE_OTHER): Payer: Medicare Other | Admitting: Psychology

## 2016-06-03 DIAGNOSIS — F331 Major depressive disorder, recurrent, moderate: Secondary | ICD-10-CM | POA: Diagnosis not present

## 2016-06-10 ENCOUNTER — Ambulatory Visit (INDEPENDENT_AMBULATORY_CARE_PROVIDER_SITE_OTHER): Payer: Medicare Other | Admitting: Psychology

## 2016-06-10 DIAGNOSIS — F411 Generalized anxiety disorder: Secondary | ICD-10-CM

## 2016-06-17 ENCOUNTER — Ambulatory Visit (INDEPENDENT_AMBULATORY_CARE_PROVIDER_SITE_OTHER): Payer: Medicare Other | Admitting: Psychology

## 2016-06-17 DIAGNOSIS — F411 Generalized anxiety disorder: Secondary | ICD-10-CM

## 2016-06-24 ENCOUNTER — Ambulatory Visit (INDEPENDENT_AMBULATORY_CARE_PROVIDER_SITE_OTHER): Payer: Medicare Other | Admitting: Psychology

## 2016-06-24 DIAGNOSIS — F411 Generalized anxiety disorder: Secondary | ICD-10-CM | POA: Diagnosis not present

## 2016-07-01 ENCOUNTER — Ambulatory Visit (INDEPENDENT_AMBULATORY_CARE_PROVIDER_SITE_OTHER): Payer: Medicare Other | Admitting: Psychology

## 2016-07-01 DIAGNOSIS — F411 Generalized anxiety disorder: Secondary | ICD-10-CM | POA: Diagnosis not present

## 2016-07-08 ENCOUNTER — Ambulatory Visit (INDEPENDENT_AMBULATORY_CARE_PROVIDER_SITE_OTHER): Payer: Medicare Other | Admitting: Psychology

## 2016-07-08 DIAGNOSIS — F411 Generalized anxiety disorder: Secondary | ICD-10-CM | POA: Diagnosis not present

## 2016-07-15 ENCOUNTER — Ambulatory Visit (INDEPENDENT_AMBULATORY_CARE_PROVIDER_SITE_OTHER): Payer: Medicare Other | Admitting: Psychology

## 2016-07-15 DIAGNOSIS — F411 Generalized anxiety disorder: Secondary | ICD-10-CM

## 2016-07-22 ENCOUNTER — Ambulatory Visit: Payer: Medicare Other | Admitting: Psychology

## 2016-07-29 ENCOUNTER — Ambulatory Visit (INDEPENDENT_AMBULATORY_CARE_PROVIDER_SITE_OTHER): Payer: Medicare Other | Admitting: Psychology

## 2016-07-29 DIAGNOSIS — F411 Generalized anxiety disorder: Secondary | ICD-10-CM

## 2016-08-05 ENCOUNTER — Ambulatory Visit (INDEPENDENT_AMBULATORY_CARE_PROVIDER_SITE_OTHER): Payer: Medicare Other | Admitting: Psychology

## 2016-08-05 DIAGNOSIS — F411 Generalized anxiety disorder: Secondary | ICD-10-CM | POA: Diagnosis not present

## 2016-08-12 ENCOUNTER — Ambulatory Visit (INDEPENDENT_AMBULATORY_CARE_PROVIDER_SITE_OTHER): Payer: Medicare Other | Admitting: Psychology

## 2016-08-12 DIAGNOSIS — F411 Generalized anxiety disorder: Secondary | ICD-10-CM | POA: Diagnosis not present

## 2016-08-19 ENCOUNTER — Ambulatory Visit (INDEPENDENT_AMBULATORY_CARE_PROVIDER_SITE_OTHER): Payer: Medicare Other | Admitting: Psychology

## 2016-08-19 ENCOUNTER — Other Ambulatory Visit (HOSPITAL_COMMUNITY): Payer: Self-pay | Admitting: Urology

## 2016-08-19 ENCOUNTER — Ambulatory Visit (HOSPITAL_COMMUNITY)
Admission: RE | Admit: 2016-08-19 | Discharge: 2016-08-19 | Disposition: A | Discharge status: Home / Self Care | Payer: Medicare Other | Source: Ambulatory Visit | Attending: Urology | Admitting: Urology

## 2016-08-19 DIAGNOSIS — I700 Atherosclerosis of aorta: Secondary | ICD-10-CM | POA: Diagnosis not present

## 2016-08-19 DIAGNOSIS — F411 Generalized anxiety disorder: Secondary | ICD-10-CM | POA: Diagnosis not present

## 2016-08-19 DIAGNOSIS — C641 Malignant neoplasm of right kidney, except renal pelvis: Secondary | ICD-10-CM

## 2016-08-19 DIAGNOSIS — C649 Malignant neoplasm of unspecified kidney, except renal pelvis: Secondary | ICD-10-CM | POA: Diagnosis not present

## 2016-08-26 DIAGNOSIS — C641 Malignant neoplasm of right kidney, except renal pelvis: Secondary | ICD-10-CM | POA: Diagnosis not present

## 2016-09-02 ENCOUNTER — Ambulatory Visit (INDEPENDENT_AMBULATORY_CARE_PROVIDER_SITE_OTHER): Payer: Medicare Other | Admitting: Psychology

## 2016-09-02 DIAGNOSIS — F411 Generalized anxiety disorder: Secondary | ICD-10-CM | POA: Diagnosis not present

## 2016-09-09 ENCOUNTER — Ambulatory Visit: Payer: Medicare Other | Admitting: Psychology

## 2016-09-16 ENCOUNTER — Ambulatory Visit (INDEPENDENT_AMBULATORY_CARE_PROVIDER_SITE_OTHER): Payer: Medicare Other | Admitting: Psychology

## 2016-09-16 DIAGNOSIS — F411 Generalized anxiety disorder: Secondary | ICD-10-CM

## 2016-09-30 ENCOUNTER — Ambulatory Visit (INDEPENDENT_AMBULATORY_CARE_PROVIDER_SITE_OTHER): Payer: Medicare Other | Admitting: Psychology

## 2016-09-30 DIAGNOSIS — F411 Generalized anxiety disorder: Secondary | ICD-10-CM | POA: Diagnosis not present

## 2016-10-14 ENCOUNTER — Ambulatory Visit (INDEPENDENT_AMBULATORY_CARE_PROVIDER_SITE_OTHER): Payer: Medicare Other | Admitting: Psychology

## 2016-10-14 DIAGNOSIS — F411 Generalized anxiety disorder: Secondary | ICD-10-CM

## 2016-10-26 DIAGNOSIS — D225 Melanocytic nevi of trunk: Secondary | ICD-10-CM | POA: Diagnosis not present

## 2016-10-26 DIAGNOSIS — L820 Inflamed seborrheic keratosis: Secondary | ICD-10-CM | POA: Diagnosis not present

## 2016-10-26 DIAGNOSIS — Z1283 Encounter for screening for malignant neoplasm of skin: Secondary | ICD-10-CM | POA: Diagnosis not present

## 2016-11-11 ENCOUNTER — Ambulatory Visit (INDEPENDENT_AMBULATORY_CARE_PROVIDER_SITE_OTHER): Payer: Medicare Other | Admitting: Psychology

## 2016-11-11 DIAGNOSIS — F411 Generalized anxiety disorder: Secondary | ICD-10-CM | POA: Diagnosis not present

## 2016-12-09 ENCOUNTER — Ambulatory Visit (INDEPENDENT_AMBULATORY_CARE_PROVIDER_SITE_OTHER): Payer: Medicare Other | Admitting: Psychology

## 2016-12-09 DIAGNOSIS — F411 Generalized anxiety disorder: Secondary | ICD-10-CM | POA: Diagnosis not present

## 2016-12-22 ENCOUNTER — Other Ambulatory Visit: Payer: Self-pay | Admitting: Internal Medicine

## 2016-12-22 DIAGNOSIS — Z1231 Encounter for screening mammogram for malignant neoplasm of breast: Secondary | ICD-10-CM

## 2016-12-23 ENCOUNTER — Ambulatory Visit (INDEPENDENT_AMBULATORY_CARE_PROVIDER_SITE_OTHER): Payer: Medicare Other | Admitting: Psychology

## 2016-12-23 DIAGNOSIS — F411 Generalized anxiety disorder: Secondary | ICD-10-CM

## 2016-12-25 ENCOUNTER — Ambulatory Visit
Admission: RE | Admit: 2016-12-25 | Discharge: 2016-12-25 | Disposition: A | Discharge status: Home / Self Care | Payer: Medicare Other | Source: Ambulatory Visit | Attending: Internal Medicine | Admitting: Internal Medicine

## 2016-12-25 DIAGNOSIS — Z1231 Encounter for screening mammogram for malignant neoplasm of breast: Secondary | ICD-10-CM

## 2017-01-12 DIAGNOSIS — Z23 Encounter for immunization: Secondary | ICD-10-CM | POA: Diagnosis not present

## 2017-01-13 ENCOUNTER — Ambulatory Visit (INDEPENDENT_AMBULATORY_CARE_PROVIDER_SITE_OTHER): Payer: Medicare Other | Admitting: Psychology

## 2017-01-13 DIAGNOSIS — F411 Generalized anxiety disorder: Secondary | ICD-10-CM | POA: Diagnosis not present

## 2017-02-12 ENCOUNTER — Ambulatory Visit (INDEPENDENT_AMBULATORY_CARE_PROVIDER_SITE_OTHER): Payer: Medicare Other | Admitting: Psychology

## 2017-02-12 DIAGNOSIS — F411 Generalized anxiety disorder: Secondary | ICD-10-CM

## 2017-02-15 DIAGNOSIS — F411 Generalized anxiety disorder: Secondary | ICD-10-CM | POA: Diagnosis not present

## 2017-02-17 ENCOUNTER — Other Ambulatory Visit (HOSPITAL_COMMUNITY): Payer: Self-pay | Admitting: Urology

## 2017-02-17 ENCOUNTER — Ambulatory Visit (HOSPITAL_COMMUNITY)
Admission: RE | Admit: 2017-02-17 | Discharge: 2017-02-17 | Disposition: A | Discharge status: Home / Self Care | Payer: Medicare Other | Source: Ambulatory Visit | Attending: Urology | Admitting: Urology

## 2017-02-17 DIAGNOSIS — Z87891 Personal history of nicotine dependence: Secondary | ICD-10-CM | POA: Diagnosis not present

## 2017-02-17 DIAGNOSIS — Z23 Encounter for immunization: Secondary | ICD-10-CM | POA: Diagnosis not present

## 2017-02-17 DIAGNOSIS — C641 Malignant neoplasm of right kidney, except renal pelvis: Secondary | ICD-10-CM

## 2017-03-01 DIAGNOSIS — C641 Malignant neoplasm of right kidney, except renal pelvis: Secondary | ICD-10-CM | POA: Diagnosis not present

## 2017-03-01 DIAGNOSIS — K862 Cyst of pancreas: Secondary | ICD-10-CM | POA: Diagnosis not present

## 2017-03-05 DIAGNOSIS — C641 Malignant neoplasm of right kidney, except renal pelvis: Secondary | ICD-10-CM | POA: Diagnosis not present

## 2017-04-09 ENCOUNTER — Ambulatory Visit (INDEPENDENT_AMBULATORY_CARE_PROVIDER_SITE_OTHER): Payer: Medicare Other | Admitting: Psychology

## 2017-04-09 DIAGNOSIS — F411 Generalized anxiety disorder: Secondary | ICD-10-CM

## 2017-04-22 ENCOUNTER — Ambulatory Visit (INDEPENDENT_AMBULATORY_CARE_PROVIDER_SITE_OTHER): Payer: Medicare Other | Admitting: Psychology

## 2017-04-22 DIAGNOSIS — F411 Generalized anxiety disorder: Secondary | ICD-10-CM

## 2017-06-04 ENCOUNTER — Ambulatory Visit (INDEPENDENT_AMBULATORY_CARE_PROVIDER_SITE_OTHER): Payer: Medicare Other | Admitting: Psychology

## 2017-06-04 DIAGNOSIS — F411 Generalized anxiety disorder: Secondary | ICD-10-CM | POA: Diagnosis not present

## 2017-07-30 ENCOUNTER — Ambulatory Visit: Payer: Medicare Other | Admitting: Psychology

## 2017-08-02 DIAGNOSIS — F411 Generalized anxiety disorder: Secondary | ICD-10-CM | POA: Diagnosis not present

## 2017-08-11 ENCOUNTER — Ambulatory Visit (INDEPENDENT_AMBULATORY_CARE_PROVIDER_SITE_OTHER): Payer: Medicare Other | Admitting: Psychology

## 2017-08-11 DIAGNOSIS — F411 Generalized anxiety disorder: Secondary | ICD-10-CM | POA: Diagnosis not present

## 2017-09-03 ENCOUNTER — Ambulatory Visit (HOSPITAL_COMMUNITY)
Admission: RE | Admit: 2017-09-03 | Discharge: 2017-09-03 | Disposition: A | Discharge status: Home / Self Care | Payer: Medicare Other | Source: Ambulatory Visit | Attending: Urology | Admitting: Urology

## 2017-09-03 ENCOUNTER — Other Ambulatory Visit (HOSPITAL_COMMUNITY): Payer: Self-pay | Admitting: Urology

## 2017-09-03 DIAGNOSIS — C649 Malignant neoplasm of unspecified kidney, except renal pelvis: Secondary | ICD-10-CM | POA: Diagnosis not present

## 2017-09-03 DIAGNOSIS — C641 Malignant neoplasm of right kidney, except renal pelvis: Secondary | ICD-10-CM

## 2017-09-10 DIAGNOSIS — C641 Malignant neoplasm of right kidney, except renal pelvis: Secondary | ICD-10-CM | POA: Diagnosis not present

## 2017-10-06 ENCOUNTER — Ambulatory Visit (INDEPENDENT_AMBULATORY_CARE_PROVIDER_SITE_OTHER): Payer: Medicare Other | Admitting: Psychology

## 2017-10-06 DIAGNOSIS — F411 Generalized anxiety disorder: Secondary | ICD-10-CM

## 2017-12-13 ENCOUNTER — Other Ambulatory Visit: Payer: Self-pay | Admitting: Internal Medicine

## 2017-12-13 DIAGNOSIS — Z1231 Encounter for screening mammogram for malignant neoplasm of breast: Secondary | ICD-10-CM

## 2017-12-23 DIAGNOSIS — Z23 Encounter for immunization: Secondary | ICD-10-CM | POA: Diagnosis not present

## 2017-12-23 DIAGNOSIS — I10 Essential (primary) hypertension: Secondary | ICD-10-CM | POA: Diagnosis not present

## 2017-12-23 DIAGNOSIS — E669 Obesity, unspecified: Secondary | ICD-10-CM | POA: Diagnosis not present

## 2017-12-23 DIAGNOSIS — Z6833 Body mass index (BMI) 33.0-33.9, adult: Secondary | ICD-10-CM | POA: Diagnosis not present

## 2017-12-23 DIAGNOSIS — H6503 Acute serous otitis media, bilateral: Secondary | ICD-10-CM | POA: Diagnosis not present

## 2017-12-23 DIAGNOSIS — E119 Type 2 diabetes mellitus without complications: Secondary | ICD-10-CM | POA: Diagnosis not present

## 2017-12-23 DIAGNOSIS — Z1389 Encounter for screening for other disorder: Secondary | ICD-10-CM | POA: Diagnosis not present

## 2017-12-23 DIAGNOSIS — Z0000 Encounter for general adult medical examination without abnormal findings: Secondary | ICD-10-CM | POA: Diagnosis not present

## 2017-12-23 DIAGNOSIS — F419 Anxiety disorder, unspecified: Secondary | ICD-10-CM | POA: Diagnosis not present

## 2017-12-23 DIAGNOSIS — E782 Mixed hyperlipidemia: Secondary | ICD-10-CM | POA: Diagnosis not present

## 2018-01-05 ENCOUNTER — Ambulatory Visit
Admission: RE | Admit: 2018-01-05 | Discharge: 2018-01-05 | Disposition: A | Discharge status: Home / Self Care | Payer: Medicare Other | Source: Ambulatory Visit | Attending: Internal Medicine | Admitting: Internal Medicine

## 2018-01-05 DIAGNOSIS — Z1231 Encounter for screening mammogram for malignant neoplasm of breast: Secondary | ICD-10-CM

## 2018-01-07 ENCOUNTER — Other Ambulatory Visit: Payer: Self-pay | Admitting: Internal Medicine

## 2018-01-07 DIAGNOSIS — R928 Other abnormal and inconclusive findings on diagnostic imaging of breast: Secondary | ICD-10-CM

## 2018-01-12 ENCOUNTER — Ambulatory Visit
Admission: RE | Admit: 2018-01-12 | Discharge: 2018-01-12 | Disposition: A | Discharge status: Home / Self Care | Payer: Medicare Other | Source: Ambulatory Visit | Attending: Internal Medicine | Admitting: Internal Medicine

## 2018-01-12 DIAGNOSIS — N6489 Other specified disorders of breast: Secondary | ICD-10-CM | POA: Diagnosis not present

## 2018-01-12 DIAGNOSIS — R922 Inconclusive mammogram: Secondary | ICD-10-CM | POA: Diagnosis not present

## 2018-01-12 DIAGNOSIS — R928 Other abnormal and inconclusive findings on diagnostic imaging of breast: Secondary | ICD-10-CM

## 2018-01-17 DIAGNOSIS — F411 Generalized anxiety disorder: Secondary | ICD-10-CM | POA: Diagnosis not present

## 2018-01-26 ENCOUNTER — Ambulatory Visit (INDEPENDENT_AMBULATORY_CARE_PROVIDER_SITE_OTHER): Payer: Medicare Other | Admitting: Psychology

## 2018-01-26 DIAGNOSIS — F411 Generalized anxiety disorder: Secondary | ICD-10-CM | POA: Diagnosis not present

## 2018-02-03 DIAGNOSIS — R300 Dysuria: Secondary | ICD-10-CM | POA: Diagnosis not present

## 2018-03-04 ENCOUNTER — Ambulatory Visit (HOSPITAL_COMMUNITY)
Admission: RE | Admit: 2018-03-04 | Discharge: 2018-03-04 | Disposition: A | Discharge status: Home / Self Care | Payer: Medicare Other | Source: Ambulatory Visit | Attending: Urology | Admitting: Urology

## 2018-03-04 ENCOUNTER — Other Ambulatory Visit: Payer: Self-pay | Admitting: Urology

## 2018-03-04 DIAGNOSIS — J984 Other disorders of lung: Secondary | ICD-10-CM | POA: Diagnosis not present

## 2018-03-04 DIAGNOSIS — C641 Malignant neoplasm of right kidney, except renal pelvis: Secondary | ICD-10-CM | POA: Diagnosis not present

## 2018-03-04 DIAGNOSIS — Z23 Encounter for immunization: Secondary | ICD-10-CM | POA: Diagnosis not present

## 2018-03-10 DIAGNOSIS — E119 Type 2 diabetes mellitus without complications: Secondary | ICD-10-CM | POA: Diagnosis not present

## 2018-03-10 DIAGNOSIS — E7849 Other hyperlipidemia: Secondary | ICD-10-CM | POA: Diagnosis not present

## 2018-03-10 DIAGNOSIS — I10 Essential (primary) hypertension: Secondary | ICD-10-CM | POA: Diagnosis not present

## 2018-03-10 DIAGNOSIS — D3501 Benign neoplasm of right adrenal gland: Secondary | ICD-10-CM | POA: Diagnosis not present

## 2018-03-10 DIAGNOSIS — C641 Malignant neoplasm of right kidney, except renal pelvis: Secondary | ICD-10-CM | POA: Diagnosis not present

## 2018-03-10 DIAGNOSIS — Z1389 Encounter for screening for other disorder: Secondary | ICD-10-CM | POA: Diagnosis not present

## 2018-03-10 DIAGNOSIS — E782 Mixed hyperlipidemia: Secondary | ICD-10-CM | POA: Diagnosis not present

## 2018-03-16 DIAGNOSIS — C641 Malignant neoplasm of right kidney, except renal pelvis: Secondary | ICD-10-CM | POA: Diagnosis not present

## 2018-04-05 DIAGNOSIS — H25813 Combined forms of age-related cataract, bilateral: Secondary | ICD-10-CM | POA: Diagnosis not present

## 2018-04-05 DIAGNOSIS — H35372 Puckering of macula, left eye: Secondary | ICD-10-CM | POA: Diagnosis not present

## 2018-04-05 DIAGNOSIS — H50812 Duane's syndrome, left eye: Secondary | ICD-10-CM | POA: Diagnosis not present

## 2018-04-12 DIAGNOSIS — H25812 Combined forms of age-related cataract, left eye: Secondary | ICD-10-CM | POA: Diagnosis not present

## 2018-04-29 DIAGNOSIS — H269 Unspecified cataract: Secondary | ICD-10-CM | POA: Diagnosis not present

## 2018-04-29 DIAGNOSIS — H25812 Combined forms of age-related cataract, left eye: Secondary | ICD-10-CM | POA: Diagnosis not present

## 2018-04-29 DIAGNOSIS — H52202 Unspecified astigmatism, left eye: Secondary | ICD-10-CM | POA: Diagnosis not present

## 2018-04-30 ENCOUNTER — Other Ambulatory Visit: Payer: Self-pay | Admitting: Psychiatry

## 2018-05-01 NOTE — Telephone Encounter (Signed)
Need to review paper chart, not seen in epic yet  PMP last fill #10 08/2017

## 2018-05-03 NOTE — Telephone Encounter (Signed)
Could you escribe? Last fill 08/2017 #10 not seen in epic yet.

## 2018-05-05 ENCOUNTER — Other Ambulatory Visit: Payer: Self-pay

## 2018-05-05 NOTE — Telephone Encounter (Signed)
Working on prescription refill opened chart by accident

## 2018-05-09 DIAGNOSIS — H25811 Combined forms of age-related cataract, right eye: Secondary | ICD-10-CM | POA: Diagnosis not present

## 2018-05-27 DIAGNOSIS — H25811 Combined forms of age-related cataract, right eye: Secondary | ICD-10-CM | POA: Diagnosis not present

## 2018-05-27 DIAGNOSIS — H269 Unspecified cataract: Secondary | ICD-10-CM | POA: Diagnosis not present

## 2018-06-20 ENCOUNTER — Encounter: Payer: Self-pay | Admitting: Emergency Medicine

## 2018-06-20 DIAGNOSIS — G4700 Insomnia, unspecified: Secondary | ICD-10-CM | POA: Insufficient documentation

## 2018-06-20 DIAGNOSIS — F411 Generalized anxiety disorder: Secondary | ICD-10-CM | POA: Insufficient documentation

## 2018-06-21 IMAGING — CT CT BIOPSY
1 of 14 series · 8 of 32 positions shown, 13 images · non-contrast
Comparison: none

INDICATION: 65-year-old female with enhancing mass in the lower pole of the
right kidney concerning for a possible renal neoplasm. She presents
for CT-guided biopsy.

[Series 3: rtn a/p w/o · axial · non-contrast · 0.43mm/px · z∈[-284,-180]mm · 8 of 29 slices shown, 13 images]
[im 4/29  soft-tissue]
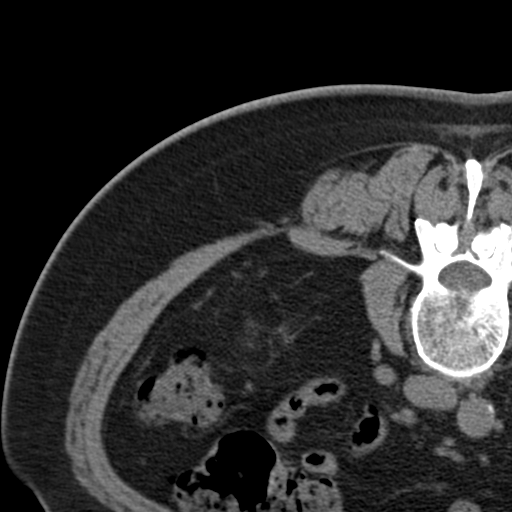
[im 4/29  bone]
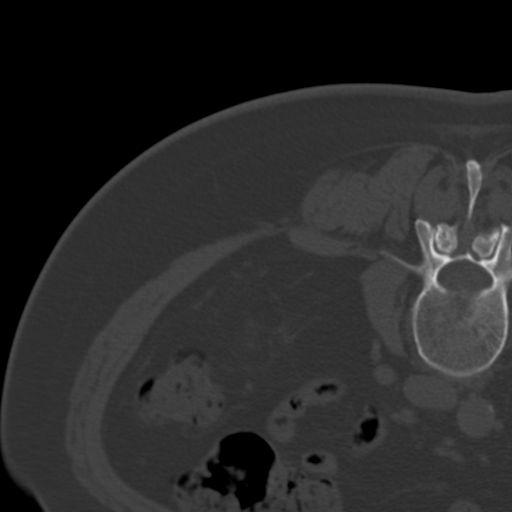
[im 7/29  soft-tissue]
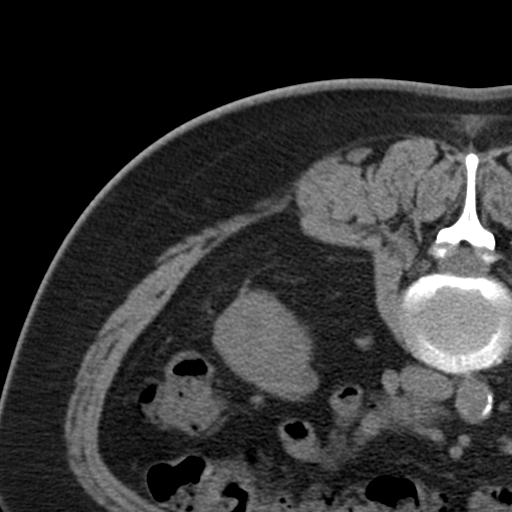
[im 10/29  soft-tissue]
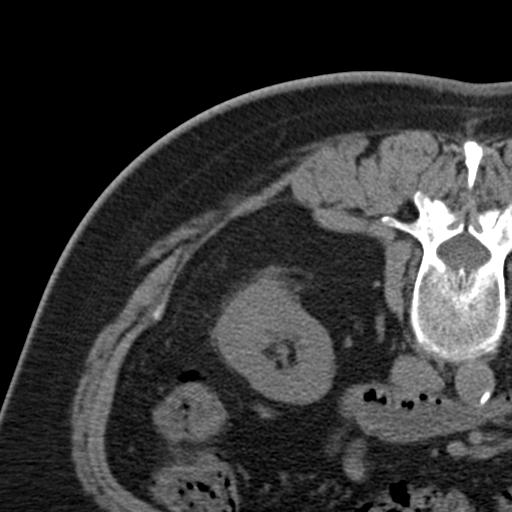
[im 13/29  soft-tissue]
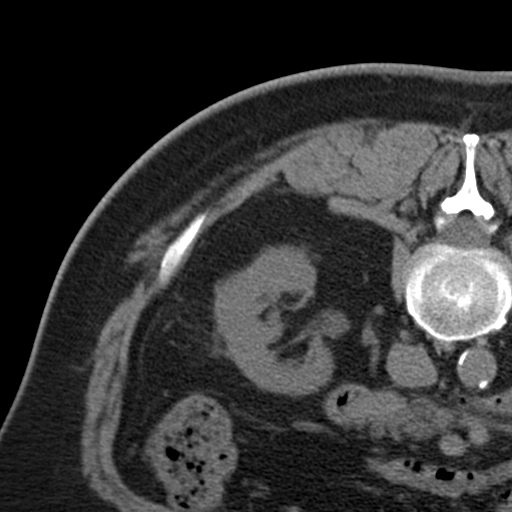
[im 16/29  soft-tissue]
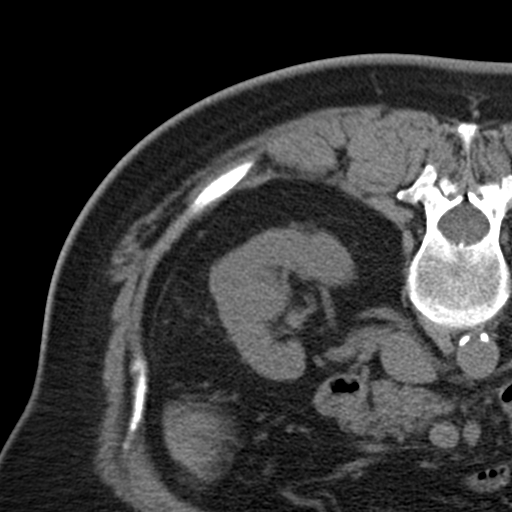
[im 16/29  lung]
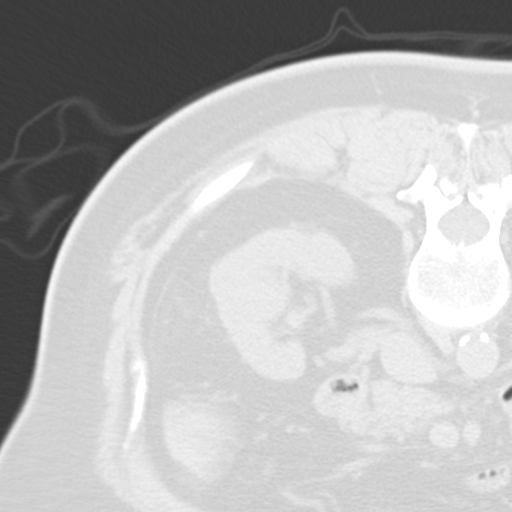
[im 19/29  soft-tissue]
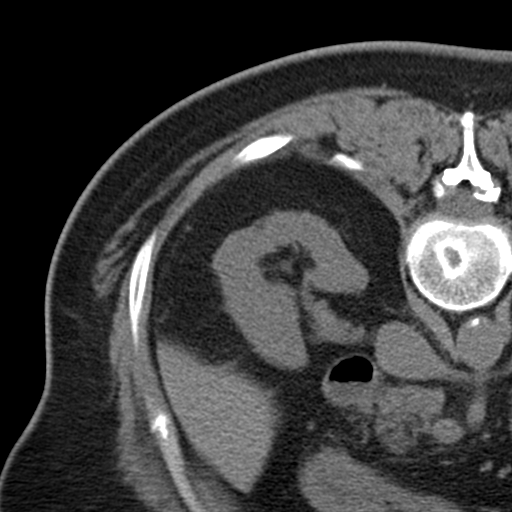
[im 19/29  lung]
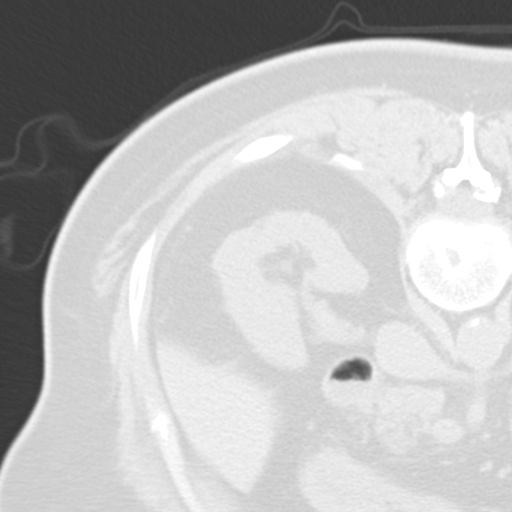
[im 22/29  soft-tissue]
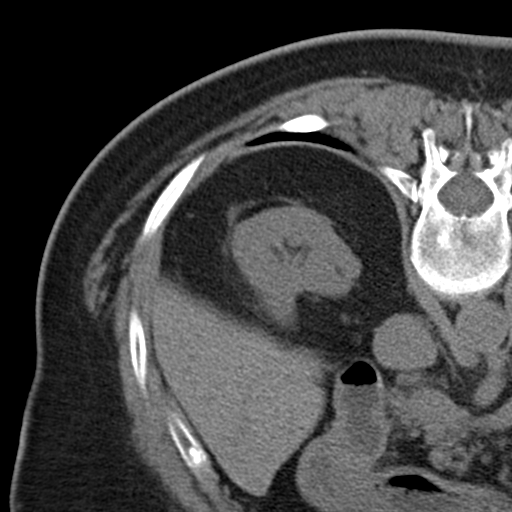
[im 22/29  lung]
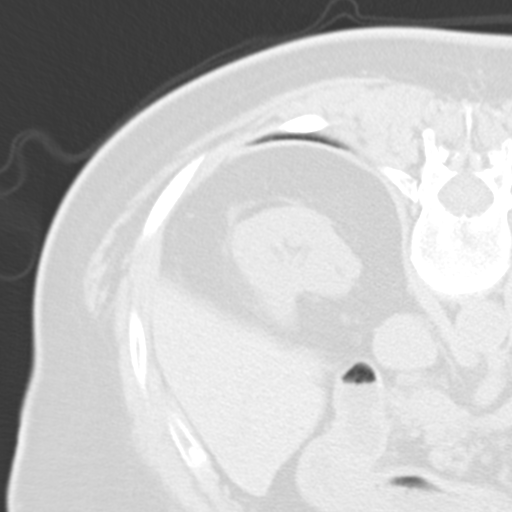
[im 25/29  soft-tissue]
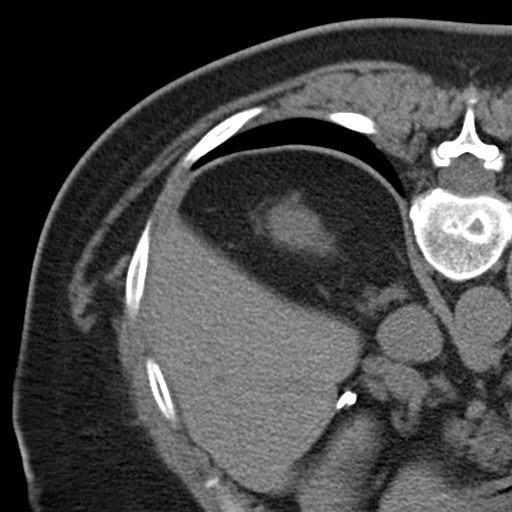
[im 25/29  lung]
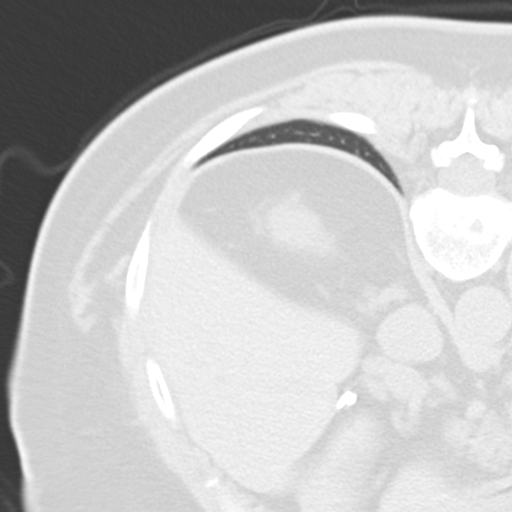

[8 of 32 positions shown; findings below may reference images not displayed]

EXAM:
CT BIOPSY

MEDICATIONS:
None.

ANESTHESIA/SEDATION:
Moderate (conscious) sedation was employed during this procedure. A
total of Versed 6 mg and Fentanyl 100 mcg was administered
intravenously.

Moderate Sedation Time: 23 minutes. The patient's level of
consciousness and vital signs were monitored continuously by
radiology nursing throughout the procedure under my direct
supervision.

FLUOROSCOPY TIME:  None

COMPLICATIONS:
None immediate.

Estimated blood loss: None

PROCEDURE:
Informed written consent was obtained from the patient after a
thorough discussion of the procedural risks, benefits and
alternatives. All questions were addressed. A timeout was performed
prior to the initiation of the procedure.

A planning axial CT scan was performed. The slight contour
abnormality in the lower pole of the right kidney was successfully
identified. An overlying skin entry site was selected and marked.
The region was sterilely prepped and draped in standard fashion
using chlorhexidine skin prep. Local anesthesia was attained by
infiltration with 1% lidocaine. A small dermatotomy was made. Under
intermittent CT guidance, a 19 gauge introducer needle was advanced
into the margin of the mass. Multiple 18 gauge core biopsies were
then obtained using the [employer] [place] Ry automated biopsy device.

Biopsy specimens were placed in formalin and delivered to pathology
for further analysis. Of note, the biopsies specimens were very
minute. A hand slurry of Gelfoam and sterile saline was injected to
the trocar needle as it was removed in an effort to embolize the
biopsy tract. Initial post biopsy axial CT imaging demonstrates no
significant hematoma or evidence of perinephric hemorrhage. The
patient tolerated the procedure well.
IMPRESSION: Technically successful CT-guided biopsy of the right lower pole
renal lesion. Of note, despite multiple needle passes, the biopsy
fragments were minute.

## 2018-07-06 ENCOUNTER — Encounter (INDEPENDENT_AMBULATORY_CARE_PROVIDER_SITE_OTHER): Payer: Self-pay | Admitting: Ophthalmology

## 2018-08-01 ENCOUNTER — Encounter (INDEPENDENT_AMBULATORY_CARE_PROVIDER_SITE_OTHER): Payer: Self-pay | Admitting: Ophthalmology

## 2018-10-14 ENCOUNTER — Other Ambulatory Visit: Payer: Self-pay

## 2018-10-14 ENCOUNTER — Telehealth: Payer: Self-pay | Admitting: Psychiatry

## 2018-10-14 MED ORDER — SERTRALINE HCL 100 MG PO TABS: 100.0000 mg | ORAL_TABLET | Freq: Every day | ORAL | 0 refills | Status: DC

## 2018-10-14 MED ORDER — ZOLPIDEM TARTRATE 10 MG PO TABS: 5.0000 mg | ORAL_TABLET | Freq: Every evening | ORAL | 0 refills | Status: DC | PRN

## 2018-10-14 NOTE — Telephone Encounter (Signed)
Pt needs refill on zoloft and zolpidem. Send to [facility] in [place]

## 2018-10-14 NOTE — Telephone Encounter (Signed)
Pended for approval, last visit 01/2018 canceled due to Covid.  zolpidem given by different provider 02/2018 #30

## 2018-10-18 ENCOUNTER — Ambulatory Visit: Payer: Medicare Other | Admitting: Psychiatry

## 2018-12-01 ENCOUNTER — Other Ambulatory Visit: Payer: Self-pay | Admitting: Internal Medicine

## 2018-12-01 DIAGNOSIS — Z1231 Encounter for screening mammogram for malignant neoplasm of breast: Secondary | ICD-10-CM

## 2019-01-16 ENCOUNTER — Ambulatory Visit: Payer: Medicare Other

## 2019-02-06 DIAGNOSIS — G4733 Obstructive sleep apnea (adult) (pediatric): Secondary | ICD-10-CM | POA: Diagnosis not present

## 2019-02-06 DIAGNOSIS — I10 Essential (primary) hypertension: Secondary | ICD-10-CM | POA: Diagnosis not present

## 2019-02-06 DIAGNOSIS — E119 Type 2 diabetes mellitus without complications: Secondary | ICD-10-CM | POA: Diagnosis not present

## 2019-02-06 DIAGNOSIS — G4700 Insomnia, unspecified: Secondary | ICD-10-CM | POA: Diagnosis not present

## 2019-02-08 DIAGNOSIS — Z23 Encounter for immunization: Secondary | ICD-10-CM | POA: Diagnosis not present

## 2019-03-01 ENCOUNTER — Telehealth: Payer: Self-pay | Admitting: Psychiatry

## 2019-03-01 DIAGNOSIS — F411 Generalized anxiety disorder: Secondary | ICD-10-CM

## 2019-03-01 MED ORDER — SERTRALINE HCL 100 MG PO TABS: 100.0000 mg | ORAL_TABLET | Freq: Every day | ORAL | 0 refills | Status: DC

## 2019-03-01 NOTE — Telephone Encounter (Signed)
Patient switching pharmacies please send refills [facility], she needs a refill on Sertraline to be sent to this pharmacy

## 2019-03-09 DIAGNOSIS — C641 Malignant neoplasm of right kidney, except renal pelvis: Secondary | ICD-10-CM | POA: Diagnosis not present

## 2019-03-13 ENCOUNTER — Other Ambulatory Visit: Payer: Self-pay

## 2019-03-13 ENCOUNTER — Ambulatory Visit
Admission: RE | Admit: 2019-03-13 | Discharge: 2019-03-13 | Disposition: A | Discharge status: Home / Self Care | Payer: Medicare Other | Source: Ambulatory Visit | Attending: Internal Medicine | Admitting: Internal Medicine

## 2019-03-13 DIAGNOSIS — Z1231 Encounter for screening mammogram for malignant neoplasm of breast: Secondary | ICD-10-CM | POA: Diagnosis not present

## 2019-03-15 ENCOUNTER — Other Ambulatory Visit: Payer: Self-pay

## 2019-03-15 ENCOUNTER — Other Ambulatory Visit (HOSPITAL_COMMUNITY): Payer: Self-pay | Admitting: Urology

## 2019-03-15 ENCOUNTER — Ambulatory Visit (HOSPITAL_COMMUNITY)
Admission: RE | Admit: 2019-03-15 | Discharge: 2019-03-15 | Disposition: A | Discharge status: Home / Self Care | Payer: Medicare Other | Source: Ambulatory Visit | Attending: Urology | Admitting: Urology

## 2019-03-15 DIAGNOSIS — C641 Malignant neoplasm of right kidney, except renal pelvis: Secondary | ICD-10-CM | POA: Insufficient documentation

## 2019-03-15 DIAGNOSIS — Z85528 Personal history of other malignant neoplasm of kidney: Secondary | ICD-10-CM | POA: Diagnosis not present

## 2019-03-29 ENCOUNTER — Other Ambulatory Visit: Payer: Self-pay

## 2019-04-18 ENCOUNTER — Other Ambulatory Visit: Payer: Self-pay | Admitting: Psychiatry

## 2019-04-18 DIAGNOSIS — F411 Generalized anxiety disorder: Secondary | ICD-10-CM

## 2019-04-18 NOTE — Telephone Encounter (Signed)
Need to check last apt

## 2019-04-19 NOTE — Telephone Encounter (Signed)
Sent message for her to set up apt, 90 day or 30 day ?

## 2019-05-18 ENCOUNTER — Ambulatory Visit (INDEPENDENT_AMBULATORY_CARE_PROVIDER_SITE_OTHER): Payer: Medicare Other | Admitting: Psychiatry

## 2019-05-18 ENCOUNTER — Encounter: Payer: Self-pay | Admitting: Psychiatry

## 2019-05-18 DIAGNOSIS — F411 Generalized anxiety disorder: Secondary | ICD-10-CM

## 2019-05-18 MED ORDER — SERTRALINE HCL 100 MG PO TABS: 100.0000 mg | ORAL_TABLET | Freq: Every day | ORAL | 3 refills | Status: DC

## 2019-05-18 NOTE — Progress Notes (Signed)
[name] [id number] 1951-03-31 69 y.o.  Virtual Visit via Telephone Note  I connected with pt on 05/18/19 at 10:30 AM EST by telephone and verified that I am speaking with the correct person using two identifiers.   I discussed the limitations, risks, security and privacy concerns of performing an evaluation and management service by telephone and the availability of in person appointments. I also discussed with the patient that there may be a patient responsible charge related to this service. The patient expressed understanding and agreed to proceed.   I discussed the assessment and treatment plan with the patient. The patient was provided an opportunity to ask questions and all were answered. The patient agreed with the plan and demonstrated an understanding of the instructions.   The patient was advised to call back or seek an in-person evaluation if the symptoms worsen or if the condition fails to improve as anticipated.  I provided 25 minutes of non-face-to-face time during this encounter.  The patient was located at home.  The provider was located at [place].   Thayer Headings, PMHNP   Subjective:   Patient ID:  [name] is a 69 y.o. (DOB Mar 11, 1951) female.  Chief Complaint:  Chief Complaint  Patient presents with  . Follow-up    h/o Anxiety, Depression, Insomnia    HPI [name] presents for follow-up of anxiety. She reports that her anxiety has been well controlled. She reports that her sleep has been adequate. She reports that she had altered sleep cycle and then adjusted her sleep schedule and now feels better. She reports that her mood has been stable. She reports some situational sadness in response to not being able to see her family around the holidays. Remains in communication with family members. She reports that her energy has been lower since she is not as physically active. She reports that her appetite has been good and has started  intentionally losing weight after gaining some weight. She reports that she has been able to concentrate without difficulty and is reading. Denies SI.   She reports that she has been staying home to minimize exposure risk.  Reports that she has been less physically active with colder temperatures.   Reports that she has not taken Xanax in 6 months.   Daughter continues to live in [place] and works for [employer].   Past Psychiatric Medication Trials: Trazodone- HA Ambien Sertraline Lexapro Klonopin Xanax  Review of Systems:  Review of Systems  Musculoskeletal: Positive for arthralgias. Negative for gait problem.  Neurological: Negative for tremors.  Psychiatric/Behavioral:       Please refer to HPI    Medications: I have reviewed the patient's current medications.  Current Outpatient Medications  Medication Sig Dispense Refill  . acetaminophen (TYLENOL) 500 MG tablet Take 1,000 mg by mouth every 6 (six) hours as needed for mild pain.    . Ascorbic Acid (VITAMIN C) 100 MG tablet Take 100 mg by mouth daily.    . cholecalciferol (VITAMIN D3) 25 MCG (1000 UNIT) tablet Take 1,000 Units by mouth daily.    Marland Kitchen losartan (COZAAR) 50 MG tablet Take 50 mg by mouth daily.    . Omega-3 Fatty Acids (FISH OIL PO) Take by mouth.    . pravastatin (PRAVACHOL) 40 MG tablet Take 20 mg by mouth every evening.  3  . sertraline (ZOLOFT) 100 MG tablet Take 1 tablet (100 mg total) by mouth daily. 90 tablet 3  . ALPRAZolam (XANAX) 0.5 MG tablet  TAKE 1 TABLET BY MOUTH EVERY DAY AS NEEDED FOR ANXIETY 10 tablet 0  . valsartan (DIOVAN) 80 MG tablet Take 80 mg by mouth daily.      Marland Kitchen zolpidem (AMBIEN) 10 MG tablet Take 0.5 tablets (5 mg total) by mouth at bedtime as needed for sleep. 30 tablet 0   No current facility-administered medications for this visit.    Medication Side Effects: None  Allergies:  Allergies  Allergen Reactions  . Ace Inhibitors Other (See Comments)    Told not to take  . Norvasc  [Amlodipine Besylate] Other (See Comments)    metallic taste in mouth  . Nsaids Other (See Comments)    Told not to take  . Omnipaque [Iohexol] Other (See Comments)    Pt states not an allergy has been told not to have the dye due to a hx of renal insufficiency, was given these instructions in 2002    Past Medical History:  Diagnosis Date  . Anxiety   . Arthritis   . Depression   . Emotional stress 01/22/2016   patient fearful of postop problems stemming from earlier experience- crying and anxious during PST appt.  . History of hysterectomy   . Hx of cholecystectomy   . Hypercholesterolemia   . Hyperlipidemia   . Hypertension   . PONV (postoperative nausea and vomiting)    anesthesia vs Morphine  . Pre-diabetes   . Renal insufficiency   . Shortness of breath dyspnea    from anxiety  . Sleep apnea     History reviewed. No pertinent family history.  Social History   Socioeconomic History  . Marital status: Married    Spouse name: Not on file  . Number of children: Not on file  . Years of education: Not on file  . Highest education level: Not on file  Occupational History  . Not on file  Tobacco Use  . Smoking status: Former Research scientist (life sciences)  . Smokeless tobacco: Never Used  Substance and Sexual Activity  . Alcohol use: No  . Drug use: No  . Sexual activity: Never  Other Topics Concern  . Not on file  Social History Narrative  . Not on file   Social Determinants of Health   Financial Resource Strain:   . Difficulty of Paying Living Expenses: Not on file  Food Insecurity:   . Worried About Charity fundraiser in the Last Year: Not on file  . Ran Out of Food in the Last Year: Not on file  Transportation Needs:   . Lack of Transportation (Medical): Not on file  . Lack of Transportation (Non-Medical): Not on file  Physical Activity:   . Days of Exercise per Week: Not on file  . Minutes of Exercise per Session: Not on file  Stress:   . Feeling of Stress : Not on file   Social Connections:   . Frequency of Communication with Friends and Family: Not on file  . Frequency of Social Gatherings with Friends and Family: Not on file  . Attends Religious Services: Not on file  . Active Member of Clubs or Organizations: Not on file  . Attends Archivist Meetings: Not on file  . Marital Status: Not on file  Intimate Partner Violence:   . Fear of Current or Ex-Partner: Not on file  . Emotionally Abused: Not on file  . Physically Abused: Not on file  . Sexually Abused: Not on file    Past Medical History, Surgical history, Social history,  and Family history were reviewed and updated as appropriate.   Please see review of systems for further details on the patient's review from today.   Objective:   Physical Exam:  There were no vitals taken for this visit.  Physical Exam Neurological:     Mental Status: She is alert and oriented to person, place, and time.     Cranial Nerves: No dysarthria.  Psychiatric:        Attention and Perception: Attention and perception normal.        Mood and Affect: Mood normal.        Speech: Speech normal.        Behavior: Behavior is cooperative.        Thought Content: Thought content normal. Thought content is not paranoid or delusional. Thought content does not include homicidal or suicidal ideation. Thought content does not include homicidal or suicidal plan.        Cognition and Memory: Cognition and memory normal.        Judgment: Judgment normal.     Comments: Insight intact     Lab Review:     Component Value Date/Time   NA 136 01/29/2016 0540   K 4.6 01/29/2016 0540   CL 104 01/29/2016 0540   CO2 26 01/29/2016 0540   GLUCOSE 134 (H) 01/29/2016 0540   BUN 9 01/29/2016 0540   CREATININE 1.09 (H) 01/29/2016 0540   CALCIUM 8.6 (L) 01/29/2016 0540   PROT 7.1 03/23/2012 1042   ALBUMIN 4.0 03/23/2012 1042   AST 21 03/23/2012 1042   ALT 25 03/23/2012 1042   ALKPHOS 61 03/23/2012 1042   BILITOT 0.4  03/23/2012 1042   GFRNONAA 52 (L) 01/29/2016 0540   GFRAA >60 01/29/2016 0540       Component Value Date/Time   WBC 7.7 01/22/2016 1000   RBC 4.45 01/22/2016 1000   HGB 11.4 (L) 01/28/2016 0528   HCT 34.8 (L) 01/28/2016 0528   PLT 217 01/22/2016 1000   MCV 89.4 01/22/2016 1000   MCH 28.3 01/22/2016 1000   MCHC 31.7 01/22/2016 1000   RDW 13.4 01/22/2016 1000   LYMPHSABS 2.1 12/18/2015 0715   MONOABS 0.5 12/18/2015 0715   EOSABS 0.1 12/18/2015 0715   BASOSABS 0.1 12/18/2015 0715    No results found for: POCLITH, LITHIUM   No results found for: PHENYTOIN, PHENOBARB, VALPROATE, CBMZ   .res Assessment: Plan:   Patient seen for 25 minutes and greater than 50% of visit spent counseling patient regarding her questions about long-term safety of sertraline and typical dosage.  Patient reports that she would like to continue sertraline at current dose at this time since her anxiety has been well controlled and may wish to consider gradual dose reduction in the future if stressors improve. Patient reports that she has not used Xanax recently and continues to have Xanax remaining from prescription 1 year ago.  Advised patient to contact office if she needs a refill of Xanax. Patient to follow-up with this provider in 1 year or sooner if clinically indicated. Patient advised to contact office with any questions, adverse effects, or acute worsening in signs and symptoms.  Mikaila was seen today for follow-up.  Diagnoses and all orders for this visit:  Generalized anxiety disorder -     sertraline (ZOLOFT) 100 MG tablet; Take 1 tablet (100 mg total) by mouth daily.    Please see After Visit Summary for patient specific instructions.  Future Appointments  Date Time Provider [place]  05/17/2020  2:00 PM Thayer Headings, PMHNP CP-CP None    No orders of the defined types were placed in this encounter.     -------------------------------

## 2019-05-26 ENCOUNTER — Ambulatory Visit: Payer: Medicare Other | Attending: Internal Medicine

## 2019-05-26 DIAGNOSIS — Z23 Encounter for immunization: Secondary | ICD-10-CM

## 2019-05-26 NOTE — Progress Notes (Signed)
   Covid-19 Vaccination Clinic  Name:  [name]    MRN: [id number] DOB: [birth year]  05/26/2019  [name] was observed post Covid-19 immunization for 15 minutes without incidence. She was provided with Vaccine Information Sheet and instruction to access the V-Safe system.   [name] was instructed to call 911 with any severe reactions post vaccine: Marland Kitchen Difficulty breathing  . Swelling of your face and throat  . A fast heartbeat  . A bad rash all over your body  . Dizziness and weakness    Immunizations Administered    Name Date Dose VIS Date Route   Pfizer COVID-19 Vaccine 05/26/2019  2:12 PM 0.3 mL 04/14/2019 Intramuscular   Manufacturer: [place]   Lot: [id number]   [place]: [id number]

## 2019-06-14 ENCOUNTER — Ambulatory Visit: Payer: Medicare Other

## 2019-06-16 ENCOUNTER — Ambulatory Visit: Payer: Medicare Other | Attending: Internal Medicine

## 2019-06-16 DIAGNOSIS — Z23 Encounter for immunization: Secondary | ICD-10-CM | POA: Insufficient documentation

## 2019-06-16 NOTE — Progress Notes (Signed)
   Covid-19 Vaccination Clinic  Name:  MOET DEBRUYNE    MRN: [id number] DOB: Feb 19, 1951  06/16/2019  Ms. Funez was observed post Covid-19 immunization for 15 minutes without incidence. She was provided with Vaccine Information Sheet and instruction to access the V-Safe system.   Ms. Mclaren was instructed to call 911 with any severe reactions post vaccine: Marland Kitchen Difficulty breathing  . Swelling of your face and throat  . A fast heartbeat  . A bad rash all over your body  . Dizziness and weakness    Immunizations Administered    Name Date Dose VIS Date Route   Pfizer COVID-19 Vaccine 06/16/2019  8:22 AM 0.3 mL 04/14/2019 Intramuscular   Manufacturer: [place]   Lot: [id number]   [place]: [id number]

## 2019-08-22 IMAGING — DX DG CHEST 2V
2 series · 2 of 2 positions shown · non-contrast
Comparison: 08/19/2016

CLINICAL DATA: Followup right renal cell carcinoma.  Former smoker.

EXAM:
CHEST  2 VIEW

[chest pa]
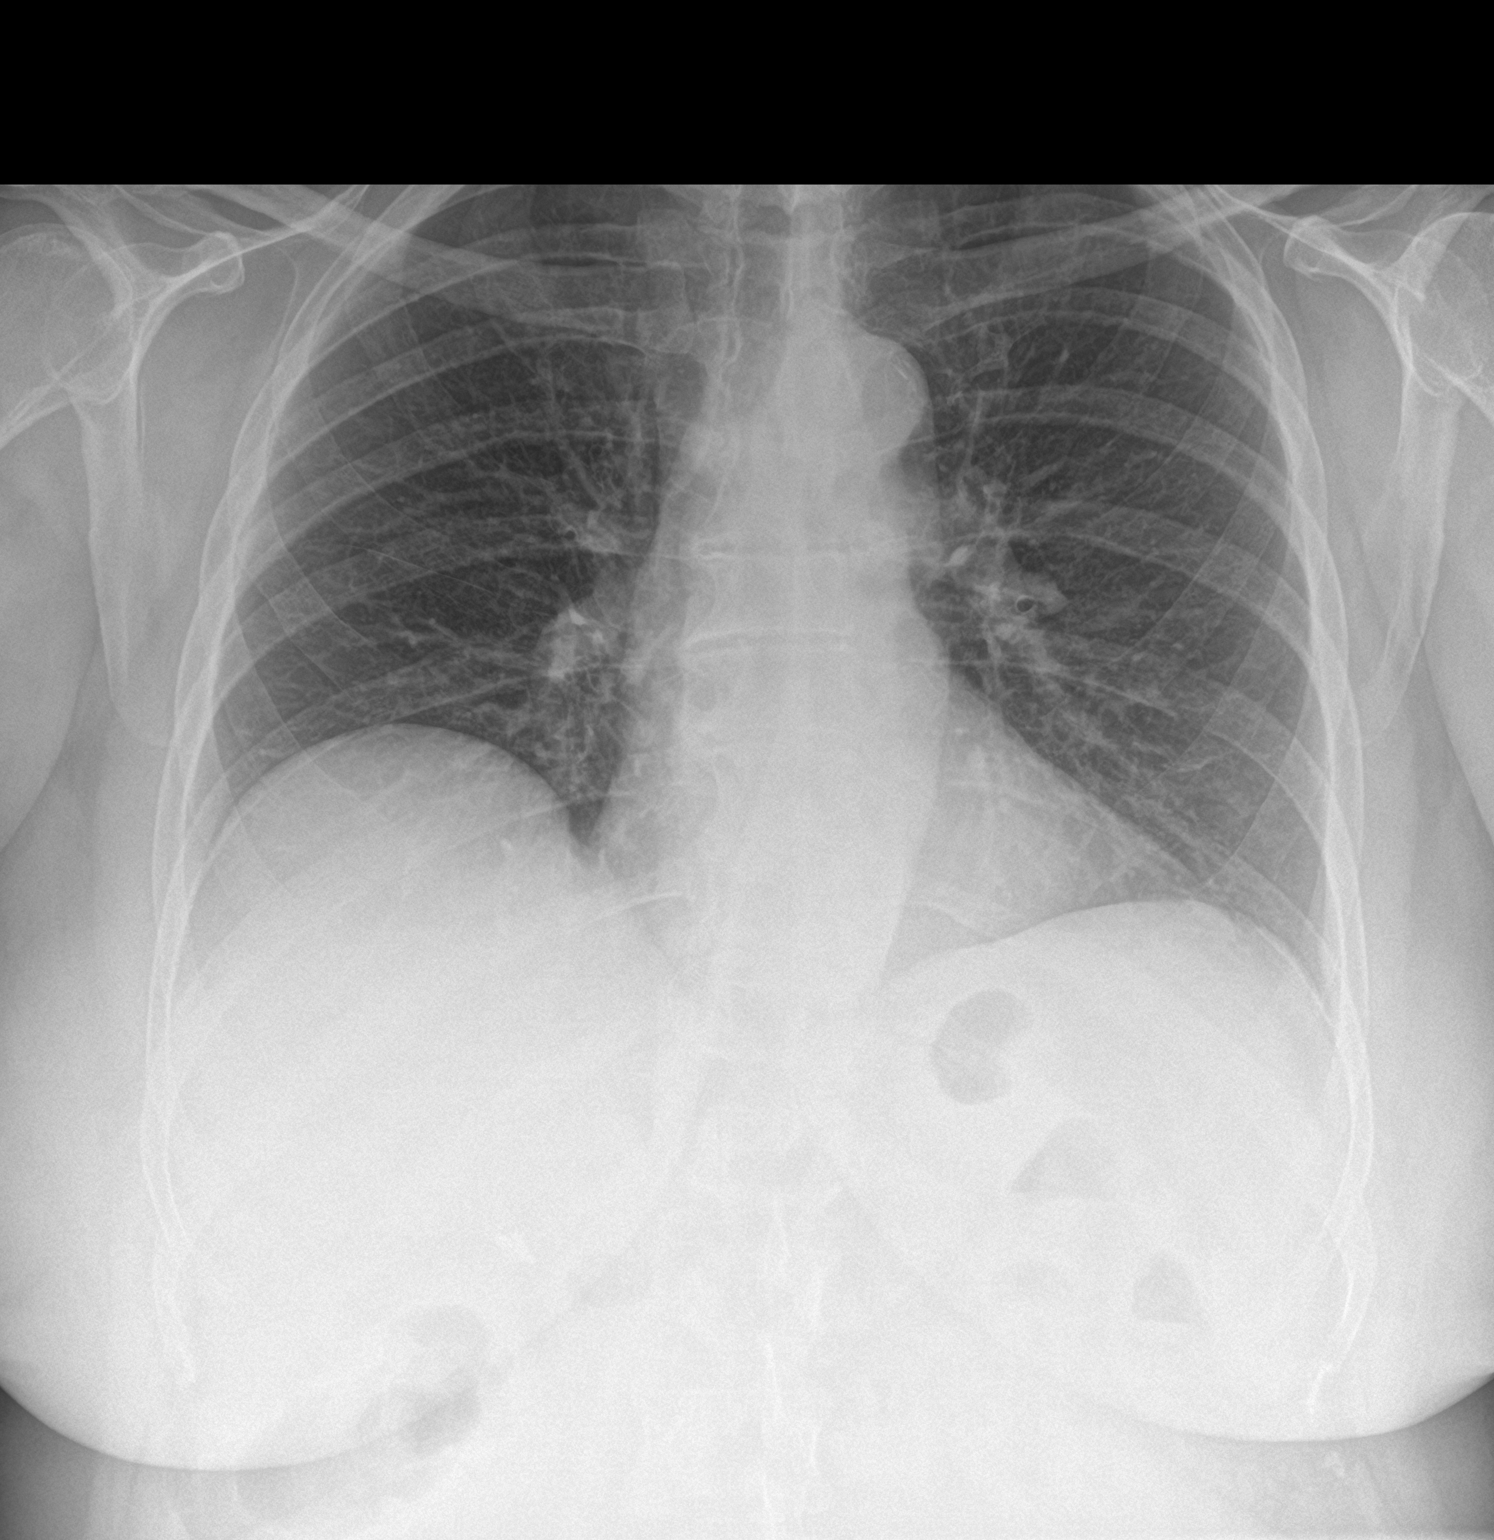

[chest lat]
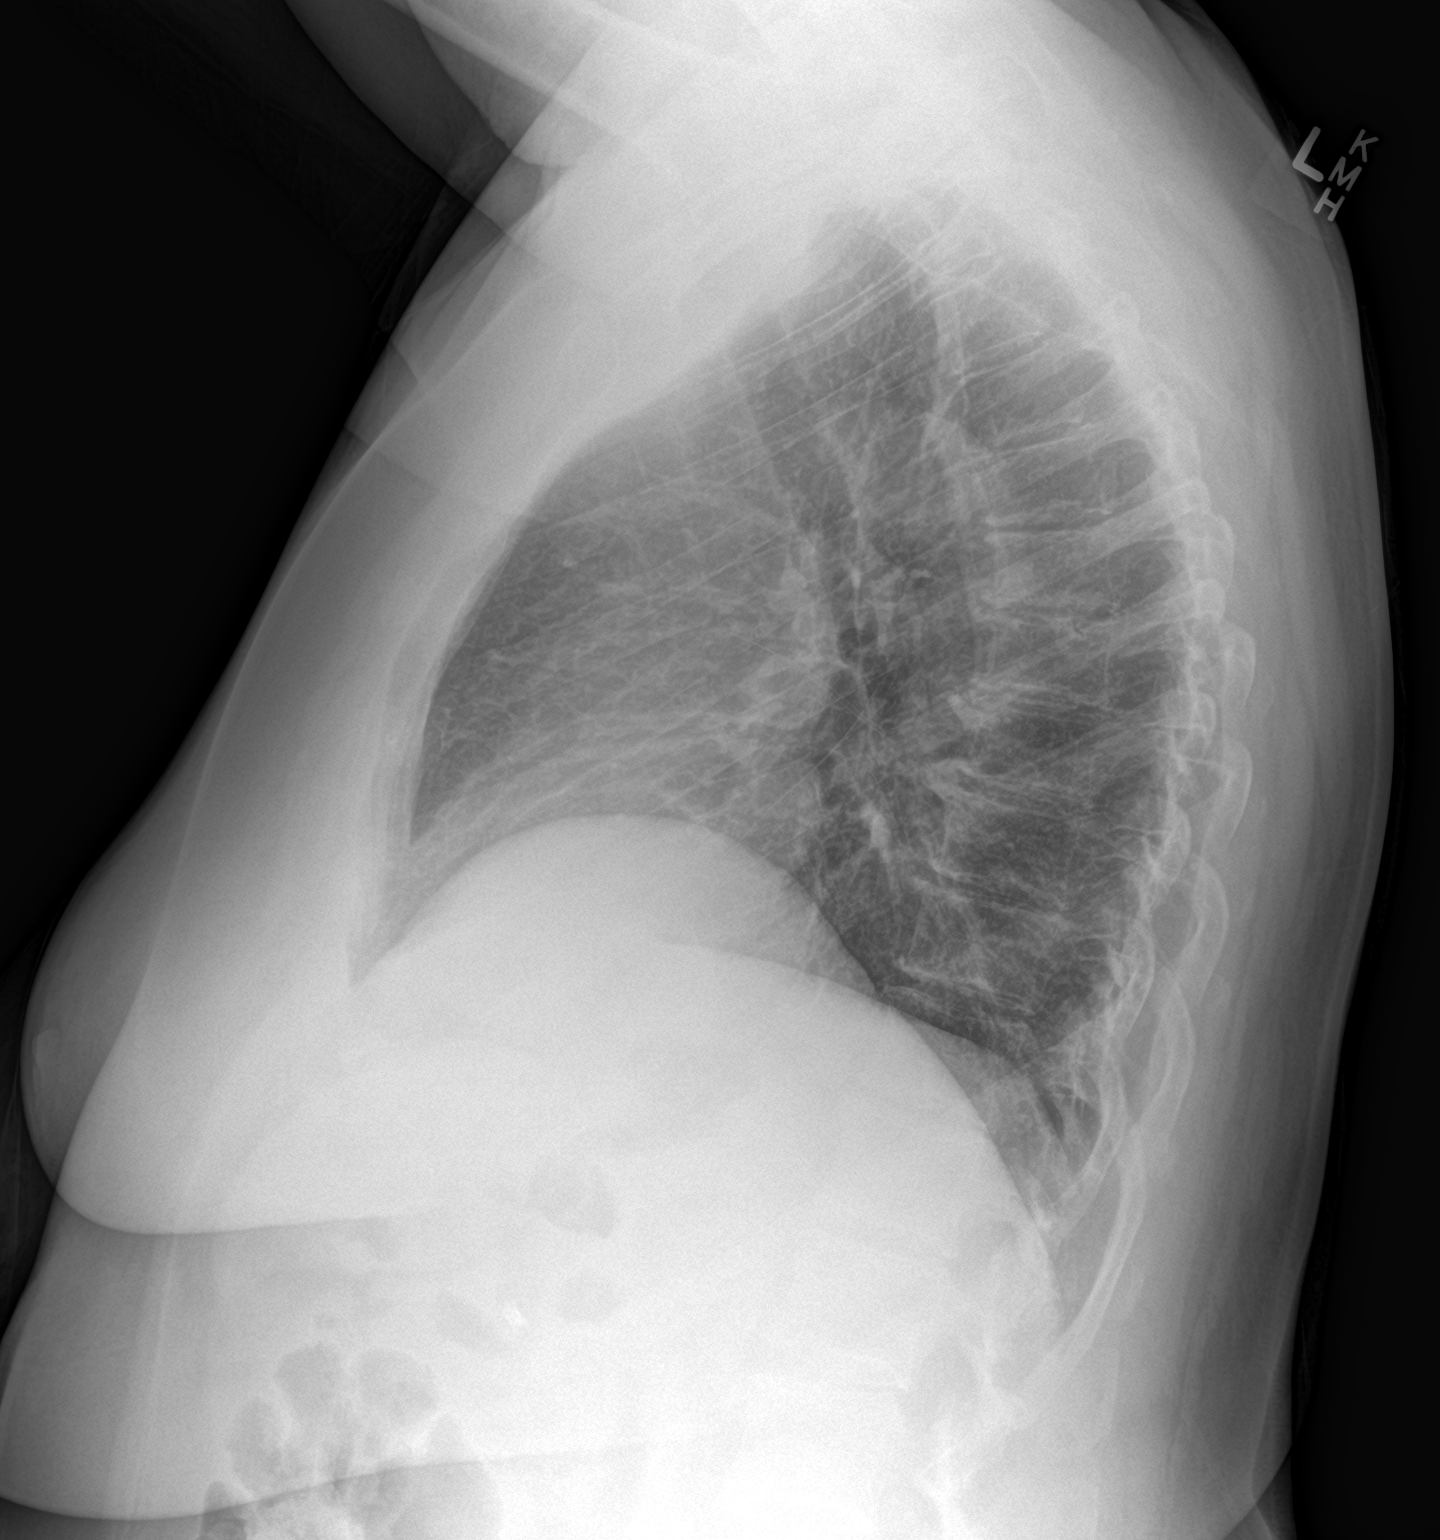

[2 of 2 positions shown; findings below may reference images not displayed]

FINDINGS: The heart size and mediastinal contours are within normal limits.
Aortic atherosclerosis. Both lungs are clear. Mild thoracic spine
degenerative changes again noted.
IMPRESSION: Stable exam.  No active cardiopulmonary disease.

## 2019-09-08 ENCOUNTER — Other Ambulatory Visit: Payer: Self-pay

## 2019-09-08 ENCOUNTER — Ambulatory Visit (INDEPENDENT_AMBULATORY_CARE_PROVIDER_SITE_OTHER): Payer: Medicare Other | Admitting: Ophthalmology

## 2019-09-08 ENCOUNTER — Encounter (INDEPENDENT_AMBULATORY_CARE_PROVIDER_SITE_OTHER): Payer: Self-pay | Admitting: Ophthalmology

## 2019-09-08 DIAGNOSIS — H3581 Retinal edema: Secondary | ICD-10-CM

## 2019-09-08 DIAGNOSIS — I10 Essential (primary) hypertension: Secondary | ICD-10-CM

## 2019-09-08 DIAGNOSIS — H43811 Vitreous degeneration, right eye: Secondary | ICD-10-CM

## 2019-09-08 DIAGNOSIS — Z961 Presence of intraocular lens: Secondary | ICD-10-CM

## 2019-09-08 DIAGNOSIS — H35033 Hypertensive retinopathy, bilateral: Secondary | ICD-10-CM | POA: Diagnosis not present

## 2019-09-08 DIAGNOSIS — H35372 Puckering of macula, left eye: Secondary | ICD-10-CM | POA: Diagnosis not present

## 2019-09-08 NOTE — Progress Notes (Signed)
[facility] Note  09/08/2019     CHIEF COMPLAINT Patient presents for Retina Evaluation   HISTORY OF PRESENT ILLNESS: [name] is a 69 y.o. female who presents to the clinic today for:   HPI    Retina Evaluation    In right eye.  This started 1 day ago.  Associated Symptoms Distortion.  Context:  distance vision and near vision.  I, the attending physician,  performed the HPI with the patient and updated documentation appropriately.          Comments    "Pearline Cables film centrally with black things hanging down like legs on a bug" OD x1 day.  This is constant.  "Hazy" vision OD.  Denies of FOLs.  OS stable (OS was never as good as OD after cataract sx). 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 (History)    pt is a regular pt of Dr. Vella Redhead and Dr. Yong Channel did her cataract sx, pt has toric lenses OU, pt states her vision has blurry OS even after cataract sx, she states she was told she has a "layer" on the back of her eye that needs to be peeled off, she states today she is seeing black floaters in the right eye only, started yesterday, she has not noticed any fol or floaters, she feels like the floaters are worse outside than inside  Referring physician: Madelin Headings, DO [address] [place],  [place] [address]  HISTORICAL INFORMATION:   Selected notes from the MEDICAL RECORD NUMBER Referred by Dr. Truman Hayward:  Ocular Hx- PMH-   CURRENT MEDICATIONS: No current outpatient medications on file. (Ophthalmic Drugs)   No current facility-administered medications for this visit. (Ophthalmic Drugs)   Current Outpatient Medications (Other)  Medication Sig  . acetaminophen (TYLENOL) 500 MG tablet Take 1,000 mg by mouth every 6 (six) hours as needed for mild pain.  Marland Kitchen ALPRAZolam (XANAX) 0.5 MG tablet TAKE 1 TABLET BY MOUTH EVERY DAY AS NEEDED FOR ANXIETY  . Ascorbic Acid  (VITAMIN C) 100 MG tablet Take 100 mg by mouth daily.  . cholecalciferol (VITAMIN D3) 25 MCG (1000 UNIT) tablet Take 1,000 Units by mouth daily.  Marland Kitchen losartan (COZAAR) 50 MG tablet Take 50 mg by mouth daily.  . Omega-3 Fatty Acids (FISH OIL PO) Take by mouth.  . pravastatin (PRAVACHOL) 40 MG tablet Take 20 mg by mouth every evening.  . sertraline (ZOLOFT) 100 MG tablet Take 1 tablet (100 mg total) by mouth daily.  . valsartan (DIOVAN) 80 MG tablet Take 80 mg by mouth daily.    Marland Kitchen zolpidem (AMBIEN) 10 MG tablet Take 0.5 tablets (5 mg total) by mouth at bedtime as needed for sleep.   No current facility-administered medications for this visit. (Other)      REVIEW OF SYSTEMS: ROS    Positive for: Cardiovascular, Psychiatric   Last edited by Leonie Douglas, COA on 09/08/2019  2:01 PM. (History)       ALLERGIES Allergies  Allergen Reactions  . Ace Inhibitors Other (See Comments)    Told not to take  . Norvasc [Amlodipine Besylate] Other (See Comments)    metallic taste in mouth  . Nsaids Other (See Comments)    Told not to take  . Omnipaque [Iohexol] Other (See Comments)    Pt states not an allergy has been told  not to have the dye due to a hx of renal insufficiency, was given these instructions in 2002    PAST MEDICAL HISTORY Past Medical History:  Diagnosis Date  . Anxiety   . Arthritis   . Depression   . Emotional stress 01/22/2016   patient fearful of postop problems stemming from earlier experience- crying and anxious during PST appt.  . History of hysterectomy   . Hx of cholecystectomy   . Hypercholesterolemia   . Hyperlipidemia   . Hypertension   . PONV (postoperative nausea and vomiting)    anesthesia vs Morphine  . Pre-diabetes   . Renal insufficiency   . Shortness of breath dyspnea    from anxiety  . Sleep apnea    Past Surgical History:  Procedure Laterality Date  . ABDOMINAL HYSTERECTOMY    . CESAREAN SECTION     x2  . CHOLECYSTECTOMY    . ROBOTIC  ASSITED PARTIAL NEPHRECTOMY Right 01/27/2016   Procedure: XI ROBOTIC ASSITED PARTIAL NEPHRECTOMY;  Surgeon: Raynelle Bring, MD;  Location: [facility];  Service: Urology;  Laterality: Right;    FAMILY HISTORY History reviewed. No pertinent family history.  SOCIAL HISTORY Social History   Tobacco Use  . Smoking status: Former Research scientist (life sciences)  . Smokeless tobacco: Never Used  Substance Use Topics  . Alcohol use: No  . Drug use: No         OPHTHALMIC EXAM:  Base Eye Exam    Visual Acuity (Snellen - Linear)      Right Left   Dist [place] 20/25 +2 20/70 -2   Dist ph [place]  NI       Tonometry (Tonopen, 2:14 PM)      Right Left   Pressure 16 14       Pupils      Dark Light Shape React APD   Right 3 2 Round Brisk None   Left 3 2 Round Brisk None       Visual Fields (Counting fingers)      Left Right    Full Full       Extraocular Movement      Right Left    Full Abnormal    -- -- --  --  --  -- -- --   -- -- --  --  --  -- -- --         Neuro/Psych    Oriented x3: Yes   Mood/Affect: Normal       Dilation    Both eyes: 1.0% Mydriacyl, 2.5% Phenylephrine @ 2:12 PM        Slit Lamp and Fundus Exam    Slit Lamp Exam      Right Left   Lids/Lashes Dermatochalasis - upper lid Dermatochalasis - upper lid   Conjunctiva/Sclera White and quiet White and quiet   Cornea mild Debris in tear film, 1-2+ inferior Punctate epithelial erosions, well healed- cataract wounds mild Debris in tear film, 1-2+ inferior Punctate epithelial erosions, well healed- cataract wounds   Anterior Chamber Deep and quiet Deep and quiet   Iris Round and dilated Round and dilated   Lens Toric PC IOL in good position with marks at 0330 and 0930 Toric PC IOL in good position with marks at 1230 and 0630   Vitreous Vitreous syneresis, Posterior vitreous detachment, vitreous condensations Vitreous syneresis, Posterior vitreous detachment       Fundus Exam      Right Left   Disc Compact, Pink and Sharp Compact,  Pink and Sharp   C/D Ratio 0.1 0.1   Macula Flat, Good foveal reflex, mild Retinal pigment epithelial mottling Flat, blunted foveal reflex, +ERM with straie    Vessels Vascular attenuation, Tortuous Vascular attenuation, Tortuous, AV crossing changes   Periphery Attached, no heme, no RT/RD on 360 scleral depressed exam   Attached, no heme, no RT/RD           Refraction    Manifest Refraction      Sphere Cylinder Axis Dist VA   Right -0.25 +0.25 080 20/20   Left -0.50 +1.25 145 20/50          IMAGING AND PROCEDURES  Imaging and Procedures for @TODAY @  OCT, Retina - OU - Both Eyes       Right Eye Quality was good. Central Foveal Thickness: 251. Progression has no prior data. Findings include normal foveal contour, no IRF, no SRF (Irregular RPE contour, +vitreous opacities).   Left Eye Quality was good. Central Foveal Thickness: 451. Progression has no prior data. Findings include epiretinal membrane, abnormal foveal contour, no IRF, no SRF, macular pucker.   Notes *Images captured and stored on drive  Diagnosis / Impression:  OD: NFP, no IRF/SRF; Irregular RPE contour, +vitreous opacities OS: ERM with pucker; no IRF/SRF  Clinical management:  See below  Abbreviations: NFP - Normal foveal profile. CME - cystoid macular edema. PED - pigment epithelial detachment. IRF - intraretinal fluid. SRF - subretinal fluid. EZ - ellipsoid zone. ERM - epiretinal membrane. ORA - outer retinal atrophy. ORT - outer retinal tubulation. SRHM - subretinal hyper-reflective material                 ASSESSMENT/PLAN:    ICD-10-CM   1. Posterior vitreous detachment of right eye  H43.811   2. Retinal edema  H35.81 OCT, Retina - OU - Both Eyes  3. Epiretinal membrane (ERM) of left eye  H35.372   4. Essential hypertension  I10   5. Hypertensive retinopathy of both eyes  H35.033   6. Pseudophakia of both eyes  Z96.1     1,2. PVD / vitreous syneresis OD  - acute PVD with symptomatic  floaters x 1 day (5.6.21)  - Discussed findings and prognosis  - No RT or RD on 360 scleral depressed exam  - Reviewed s/s of RT/RD  - Strict return precautions for any such RT/RD signs/symptoms  - f/u 4 weeks  3. Epiretinal membrane, OS  - pt originally referred to Dr. Zigmund Daniel for ERM eval OS in early 2020, but pt postponed consult due to COVID  - The natural history, anatomy, potential for loss of vision, and treatment options including vitrectomy techniques and the complications of endophthalmitis, retinal detachment, vitreous hemorrhage, cataract progression and permanent vision loss discussed with the patient.  - +ERM w/ BCVA 20/50 (Mrx)  - monitor for now  - f/u 4 wks for recheck  4,5. Hypertensive retinopathy OU  - discussed importance of tight BP control  - monitor  6. Pseudophakia OU   - s/p CE/toric IOL OU (Dr. Geoffry Paradise)  - IOLs in good position, doing well  - monitor   Ophthalmic Meds Ordered this visit:  No orders of the defined types were placed in this encounter.      Return in about 4 weeks (around 10/06/2019) for f/u PVD OD, DFE, OCT.  There are no Patient Instructions on file for this visit.   Explained the diagnoses, plan, and follow up with the patient and they expressed  understanding.  Patient expressed understanding of the importance of proper follow up care.   Gardiner Sleeper, M.D., Ph.D. Diseases & Surgery of the Retina and Vitreous [facility]  I have reviewed the above documentation for accuracy and completeness, and I agree with the above. Gardiner Sleeper, M.D., Ph.D. 09/10/19 11:58 PM     Abbreviations: M myopia (nearsighted); A astigmatism; H hyperopia (farsighted); P presbyopia; Mrx spectacle prescription;  CTL contact lenses; OD right eye; OS left eye; OU both eyes  XT exotropia; ET esotropia; PEK punctate epithelial keratitis; PEE punctate epithelial erosions; DES dry eye syndrome; MGD meibomian gland dysfunction; ATs  artificial tears; PFAT's preservative free artificial tears; [place] nuclear sclerotic cataract; PSC posterior subcapsular cataract; ERM epi-retinal membrane; PVD posterior vitreous detachment; RD retinal detachment; DM diabetes mellitus; DR diabetic retinopathy; NPDR non-proliferative diabetic retinopathy; PDR proliferative diabetic retinopathy; CSME clinically significant macular edema; DME diabetic macular edema; dbh dot blot hemorrhages; CWS cotton wool spot; POAG primary open angle glaucoma; C/D cup-to-disc ratio; HVF humphrey visual field; GVF goldmann visual field; OCT optical coherence tomography; IOP intraocular pressure; BRVO Branch retinal vein occlusion; CRVO central retinal vein occlusion; CRAO central retinal artery occlusion; BRAO branch retinal artery occlusion; RT retinal tear; SB scleral buckle; PPV pars plana vitrectomy; VH Vitreous hemorrhage; PRP panretinal laser photocoagulation; IVK intravitreal kenalog; VMT vitreomacular traction; MH Macular hole;  NVD neovascularization of the disc; NVE neovascularization elsewhere; AREDS age related eye disease study; ARMD age related macular degeneration; POAG primary open angle glaucoma; EBMD epithelial/anterior basement membrane dystrophy; ACIOL anterior chamber intraocular lens; IOL intraocular lens; PCIOL posterior chamber intraocular lens; Phaco/IOL phacoemulsification with intraocular lens placement; [place] photorefractive keratectomy; LASIK laser assisted in situ keratomileusis; HTN hypertension; DM diabetes mellitus; COPD chronic obstructive pulmonary disease

## 2019-10-06 ENCOUNTER — Encounter (INDEPENDENT_AMBULATORY_CARE_PROVIDER_SITE_OTHER): Payer: Medicare Other | Admitting: Ophthalmology

## 2019-10-06 ENCOUNTER — Other Ambulatory Visit: Payer: Self-pay

## 2019-10-06 DIAGNOSIS — I10 Essential (primary) hypertension: Secondary | ICD-10-CM | POA: Diagnosis not present

## 2019-10-06 DIAGNOSIS — H35033 Hypertensive retinopathy, bilateral: Secondary | ICD-10-CM

## 2019-10-06 DIAGNOSIS — H43813 Vitreous degeneration, bilateral: Secondary | ICD-10-CM | POA: Diagnosis not present

## 2019-10-06 DIAGNOSIS — H35372 Puckering of macula, left eye: Secondary | ICD-10-CM | POA: Diagnosis not present

## 2020-01-12 DIAGNOSIS — R8279 Other abnormal findings on microbiological examination of urine: Secondary | ICD-10-CM | POA: Diagnosis not present

## 2020-01-12 DIAGNOSIS — R300 Dysuria: Secondary | ICD-10-CM | POA: Diagnosis not present

## 2020-02-14 DIAGNOSIS — Z23 Encounter for immunization: Secondary | ICD-10-CM | POA: Diagnosis not present

## 2020-03-11 DIAGNOSIS — Z23 Encounter for immunization: Secondary | ICD-10-CM | POA: Diagnosis not present

## 2020-03-13 DIAGNOSIS — I10 Essential (primary) hypertension: Secondary | ICD-10-CM | POA: Diagnosis not present

## 2020-03-13 DIAGNOSIS — E119 Type 2 diabetes mellitus without complications: Secondary | ICD-10-CM | POA: Diagnosis not present

## 2020-03-15 ENCOUNTER — Other Ambulatory Visit: Payer: Self-pay

## 2020-03-15 ENCOUNTER — Ambulatory Visit (HOSPITAL_COMMUNITY)
Admission: RE | Admit: 2020-03-15 | Discharge: 2020-03-15 | Disposition: A | Discharge status: Home / Self Care | Payer: Medicare Other | Source: Ambulatory Visit | Attending: Urology | Admitting: Urology

## 2020-03-15 ENCOUNTER — Other Ambulatory Visit (HOSPITAL_COMMUNITY): Payer: Self-pay | Admitting: Urology

## 2020-03-15 DIAGNOSIS — R21 Rash and other nonspecific skin eruption: Secondary | ICD-10-CM | POA: Diagnosis not present

## 2020-03-15 DIAGNOSIS — Z85528 Personal history of other malignant neoplasm of kidney: Secondary | ICD-10-CM | POA: Insufficient documentation

## 2020-03-19 DIAGNOSIS — Z85528 Personal history of other malignant neoplasm of kidney: Secondary | ICD-10-CM | POA: Diagnosis not present

## 2020-04-08 ENCOUNTER — Other Ambulatory Visit: Payer: Self-pay

## 2020-04-08 ENCOUNTER — Encounter (INDEPENDENT_AMBULATORY_CARE_PROVIDER_SITE_OTHER): Payer: Medicare Other | Admitting: Ophthalmology

## 2020-04-08 DIAGNOSIS — H35033 Hypertensive retinopathy, bilateral: Secondary | ICD-10-CM | POA: Diagnosis not present

## 2020-04-08 DIAGNOSIS — I10 Essential (primary) hypertension: Secondary | ICD-10-CM

## 2020-04-08 DIAGNOSIS — H35372 Puckering of macula, left eye: Secondary | ICD-10-CM | POA: Diagnosis not present

## 2020-04-08 DIAGNOSIS — H43813 Vitreous degeneration, bilateral: Secondary | ICD-10-CM | POA: Diagnosis not present

## 2020-05-17 ENCOUNTER — Telehealth (INDEPENDENT_AMBULATORY_CARE_PROVIDER_SITE_OTHER): Payer: Medicare Other | Admitting: Psychiatry

## 2020-05-17 ENCOUNTER — Telehealth: Payer: Self-pay | Admitting: Psychiatry

## 2020-05-17 ENCOUNTER — Encounter: Payer: Self-pay | Admitting: Psychiatry

## 2020-05-17 DIAGNOSIS — F411 Generalized anxiety disorder: Secondary | ICD-10-CM | POA: Diagnosis not present

## 2020-05-17 DIAGNOSIS — G4700 Insomnia, unspecified: Secondary | ICD-10-CM

## 2020-05-17 MED ORDER — ZOLPIDEM TARTRATE 10 MG PO TABS: 5.0000 mg | ORAL_TABLET | Freq: Every evening | ORAL | 5 refills | Status: DC | PRN

## 2020-05-17 MED ORDER — ALPRAZOLAM 0.5 MG PO TABS: ORAL_TABLET | ORAL | 0 refills | Status: DC

## 2020-05-17 MED ORDER — SERTRALINE HCL 100 MG PO TABS: 100.0000 mg | ORAL_TABLET | Freq: Every day | ORAL | 3 refills | Status: DC

## 2020-05-17 NOTE — Progress Notes (Signed)
[name] [id number] 10-Sep-1950 70 y.o.  Virtual Visit via Telephone Note  I connected with pt on 05/17/20 at  1:45 PM EST by telephone and verified that I am speaking with the correct person using two identifiers.   I discussed the limitations, risks, security and privacy concerns of performing an evaluation and management service by telephone and the availability of in person appointments. I also discussed with the patient that there may be a patient responsible charge related to this service. The patient expressed understanding and agreed to proceed.   I discussed the assessment and treatment plan with the patient. The patient was provided an opportunity to ask questions and all were answered. The patient agreed with the plan and demonstrated an understanding of the instructions.   The patient was advised to call back or seek an in-person evaluation if the symptoms worsen or if the condition fails to improve as anticipated.  I provided 15 minutes of non-face-to-face time during this encounter.  The patient was located at home.  The provider was located at [place].   Thayer Headings, PMHNP   Subjective:   Patient ID:  [name] is a 70 y.o. (DOB Jan 22, 1951) female.  Chief Complaint:  Chief Complaint  Patient presents with  . Follow-up    H/o Anxiety, depression, and sleep disturbance    HPI [name] presents for follow-up of anxiety. She reports that "2021 was much better than 2020." She reports that her anxiety has ben well controlled. She reports that she thinks that she may want to stay on current dose of Sertraline for now. Denies any recent depression. Sleeping well. Appetite has been good. Energy and motivation have been good. Concentration is adequate. Denies SI.   Husband recently had eye surgery. She reports that they go exercise together and she notices improved energy after exercise. Has not been able to exercise recently while husband is  recovering from surgery. Daughter is living in [place] and working from home. She reports that her son and daughter-in-law are doing well.   She reports that she may have taken Xanax x 2-3 in the last year. Reports that she takes Ambien prn infrequently.   Past Psychiatric Medication Trials: Trazodone- HA Ambien Sertraline Lexapro Klonopin Xanax  Review of Systems:  Review of Systems  HENT: Positive for congestion.   Musculoskeletal: Negative for gait problem.  Neurological: Negative for dizziness.  Psychiatric/Behavioral:       Please refer to HPI    Medications: I have reviewed the patient's current medications.  Current Outpatient Medications  Medication Sig Dispense Refill  . Ascorbic Acid (VITAMIN C) 100 MG tablet Take 100 mg by mouth daily.    . cholecalciferol (VITAMIN D3) 25 MCG (1000 UNIT) tablet Take 1,000 Units by mouth daily.    . Omega-3 Fatty Acids (FISH OIL PO) Take by mouth.    . pravastatin (PRAVACHOL) 40 MG tablet Take 20 mg by mouth every evening.  3  . valsartan (DIOVAN) 80 MG tablet Take 80 mg by mouth daily.    Marland Kitchen acetaminophen (TYLENOL) 500 MG tablet Take 1,000 mg by mouth every 6 (six) hours as needed for mild pain.    Marland Kitchen ALPRAZolam (XANAX) 0.5 MG tablet TAKE 1 TABLET BY MOUTH EVERY DAY AS NEEDED FOR ANXIETY 10 tablet 0  . losartan (COZAAR) 50 MG tablet Take 50 mg by mouth daily.    . sertraline (ZOLOFT) 100 MG tablet Take 1 tablet (100 mg total) by mouth daily. [place]  tablet 3  . zolpidem (AMBIEN) 10 MG tablet Take 0.5 tablets (5 mg total) by mouth at bedtime as needed for sleep. 30 tablet 5   No current facility-administered medications for this visit.    Medication Side Effects: None  Allergies:  Allergies  Allergen Reactions  . Ace Inhibitors Other (See Comments)    Told not to take  . Norvasc [Amlodipine Besylate] Other (See Comments)    metallic taste in mouth  . Nsaids Other (See Comments)    Told not to take  . Omnipaque [Iohexol] Other  (See Comments)    Pt states not an allergy has been told not to have the dye due to a hx of renal insufficiency, was given these instructions in 2002    Past Medical History:  Diagnosis Date  . Anxiety   . Arthritis   . Depression   . Emotional stress 01/22/2016   patient fearful of postop problems stemming from earlier experience- crying and anxious during PST appt.  . History of hysterectomy   . Hx of cholecystectomy   . Hypercholesterolemia   . Hyperlipidemia   . Hypertension   . PONV (postoperative nausea and vomiting)    anesthesia vs Morphine  . Pre-diabetes   . Renal insufficiency   . Shortness of breath dyspnea    from anxiety  . Sleep apnea     History reviewed. No pertinent family history.  Social History   Socioeconomic History  . Marital status: Married    Spouse name: Not on file  . Number of children: Not on file  . Years of education: Not on file  . Highest education level: Not on file  Occupational History  . Not on file  Tobacco Use  . Smoking status: Former Research scientist (life sciences)  . Smokeless tobacco: Never Used  Substance and Sexual Activity  . Alcohol use: No  . Drug use: No  . Sexual activity: Never  Other Topics Concern  . Not on file  Social History Narrative  . Not on file   Social Determinants of Health   Financial Resource Strain: Not on file  Food Insecurity: Not on file  Transportation Needs: Not on file  Physical Activity: Not on file  Stress: Not on file  Social Connections: Not on file  Intimate Partner Violence: Not on file    Past Medical History, Surgical history, Social history, and Family history were reviewed and updated as appropriate.   Please see review of systems for further details on the patient's review from today.   Objective:   Physical Exam:  There were no vitals taken for this visit.  Physical Exam Neurological:     Mental Status: She is alert and oriented to person, place, and time.     Cranial Nerves: No  dysarthria.  Psychiatric:        Attention and Perception: Attention and perception normal.        Mood and Affect: Mood normal.        Speech: Speech normal.        Behavior: Behavior is cooperative.        Thought Content: Thought content normal. Thought content is not paranoid or delusional. Thought content does not include homicidal or suicidal ideation. Thought content does not include homicidal or suicidal plan.        Cognition and Memory: Cognition and memory normal.        Judgment: Judgment normal.     Comments: Insight intact     Lab Review:  Component Value Date/Time   NA 136 01/29/2016 0540   K 4.6 01/29/2016 0540   CL 104 01/29/2016 0540   CO2 26 01/29/2016 0540   GLUCOSE 134 (H) 01/29/2016 0540   BUN 9 01/29/2016 0540   CREATININE 1.09 (H) 01/29/2016 0540   CALCIUM 8.6 (L) 01/29/2016 0540   PROT 7.1 03/23/2012 1042   ALBUMIN 4.0 03/23/2012 1042   AST 21 03/23/2012 1042   ALT 25 03/23/2012 1042   ALKPHOS 61 03/23/2012 1042   BILITOT 0.4 03/23/2012 1042   GFRNONAA 52 (L) 01/29/2016 0540   GFRAA >60 01/29/2016 0540       Component Value Date/Time   WBC 7.7 01/22/2016 1000   RBC 4.45 01/22/2016 1000   HGB 11.4 (L) 01/28/2016 0528   HCT 34.8 (L) 01/28/2016 0528   PLT 217 01/22/2016 1000   MCV 89.4 01/22/2016 1000   MCH 28.3 01/22/2016 1000   MCHC 31.7 01/22/2016 1000   RDW 13.4 01/22/2016 1000   LYMPHSABS 2.1 12/18/2015 0715   MONOABS 0.5 12/18/2015 0715   EOSABS 0.1 12/18/2015 0715   BASOSABS 0.1 12/18/2015 0715    No results found for: POCLITH, LITHIUM   No results found for: PHENYTOIN, PHENOBARB, VALPROATE, CBMZ   .res Assessment: Plan:   Will continue current plan of care since target signs and symptoms are well controlled without any tolerability issues. Continue Sertraline 100 mg po q am for anxiety.  Will send scripts for Alprazolam and Ambien prn. Pt to follow-up in one year or sooner if clinically indicated.  Patient advised to  contact office with any questions, adverse effects, or acute worsening in signs and symptoms.   [name] was seen today for follow-up.  Diagnoses and all orders for this visit:  Insomnia, unspecified type -     zolpidem (AMBIEN) 10 MG tablet; Take 0.5 tablets (5 mg total) by mouth at bedtime as needed for sleep.  Generalized anxiety disorder -     sertraline (ZOLOFT) 100 MG tablet; Take 1 tablet (100 mg total) by mouth daily. -     ALPRAZolam (XANAX) 0.5 MG tablet; TAKE 1 TABLET BY MOUTH EVERY DAY AS NEEDED FOR ANXIETY    Please see After Visit Summary for patient specific instructions.  Future Appointments  Date Time Provider [place]  10/21/2020 12:15 PM Hayden Pedro, MD [facility] None  05/16/2021 12:45 PM Thayer Headings, PMHNP CP[facility]CP None    No orders of the defined types were placed in this encounter.     -------------------------------

## 2020-05-17 NOTE — Telephone Encounter (Signed)
Ms. jameson, morrow are scheduled for a virtual visit with your provider today.    Just as we do with appointments in the office, we must obtain your consent to participate.  Your consent will be active for this visit and any virtual visit you may have with one of our providers in the next 365 days.    If you have a MyChart account, I can also send a copy of this consent to you electronically.  All virtual visits are billed to your insurance company just like a traditional visit in the office.  As this is a virtual visit, video technology does not allow for your provider to perform a traditional examination.  This may limit your provider's ability to fully assess your condition.  If your provider identifies any concerns that need to be evaluated in person or the need to arrange testing such as labs, EKG, etc, we will make arrangements to do so.    Although advances in technology are sophisticated, we cannot ensure that it will always work on either your end or our end.  If the connection with a video visit is poor, we may have to switch to a telephone visit.  With either a video or telephone visit, we are not always able to ensure that we have a secure connection.   I need to obtain your verbal consent now.   Are you willing to proceed with your visit today?   SUELLYN MEENAN has provided verbal consent on 05/17/2020 for a virtual visit (video or telephone).   Thayer Headings, PMHNP 05/17/2020  1:53 PM

## 2020-05-24 ENCOUNTER — Telehealth: Payer: Self-pay | Admitting: Psychiatry

## 2020-05-24 DIAGNOSIS — F411 Generalized anxiety disorder: Secondary | ICD-10-CM

## 2020-05-24 MED ORDER — SERTRALINE HCL 100 MG PO TABS: 100.0000 mg | ORAL_TABLET | Freq: Every day | ORAL | 3 refills | Status: DC

## 2020-05-24 NOTE — Telephone Encounter (Signed)
Rec'd fax refill request from [facility] for Sertraline. Refill sent

## 2020-09-02 ENCOUNTER — Telehealth: Payer: Self-pay

## 2020-09-02 NOTE — Telephone Encounter (Signed)
Prior Approval received for ZOLPIDEM 10 MG #30 effective 09/02/2020-09/02/2021 with [facility] Medicare ID# [id number]. #30/30 up to 90 day supply can be written.

## 2020-10-21 ENCOUNTER — Other Ambulatory Visit: Payer: Self-pay

## 2020-10-21 ENCOUNTER — Encounter (INDEPENDENT_AMBULATORY_CARE_PROVIDER_SITE_OTHER): Payer: Medicare Other | Admitting: Ophthalmology

## 2020-10-21 DIAGNOSIS — H43813 Vitreous degeneration, bilateral: Secondary | ICD-10-CM | POA: Diagnosis not present

## 2020-10-21 DIAGNOSIS — I10 Essential (primary) hypertension: Secondary | ICD-10-CM | POA: Diagnosis not present

## 2020-10-21 DIAGNOSIS — H35372 Puckering of macula, left eye: Secondary | ICD-10-CM | POA: Diagnosis not present

## 2020-10-21 DIAGNOSIS — H35033 Hypertensive retinopathy, bilateral: Secondary | ICD-10-CM

## 2021-03-03 ENCOUNTER — Other Ambulatory Visit: Payer: Self-pay

## 2021-03-03 ENCOUNTER — Other Ambulatory Visit (HOSPITAL_COMMUNITY): Payer: Self-pay | Admitting: Urology

## 2021-03-03 ENCOUNTER — Ambulatory Visit (HOSPITAL_COMMUNITY)
Admission: RE | Admit: 2021-03-03 | Discharge: 2021-03-03 | Disposition: A | Discharge status: Home / Self Care | Payer: Medicare Other | Source: Ambulatory Visit | Attending: Urology | Admitting: Urology

## 2021-03-03 DIAGNOSIS — Z85528 Personal history of other malignant neoplasm of kidney: Secondary | ICD-10-CM

## 2021-03-03 DIAGNOSIS — C649 Malignant neoplasm of unspecified kidney, except renal pelvis: Secondary | ICD-10-CM | POA: Diagnosis not present

## 2021-03-04 DIAGNOSIS — Z23 Encounter for immunization: Secondary | ICD-10-CM | POA: Diagnosis not present

## 2021-03-05 DIAGNOSIS — Z85528 Personal history of other malignant neoplasm of kidney: Secondary | ICD-10-CM | POA: Diagnosis not present

## 2021-05-16 ENCOUNTER — Other Ambulatory Visit: Payer: Self-pay

## 2021-05-16 ENCOUNTER — Ambulatory Visit (INDEPENDENT_AMBULATORY_CARE_PROVIDER_SITE_OTHER): Payer: Medicare Other | Admitting: Psychiatry

## 2021-05-16 ENCOUNTER — Encounter: Payer: Self-pay | Admitting: Psychiatry

## 2021-05-16 DIAGNOSIS — G4700 Insomnia, unspecified: Secondary | ICD-10-CM | POA: Diagnosis not present

## 2021-05-16 DIAGNOSIS — F411 Generalized anxiety disorder: Secondary | ICD-10-CM

## 2021-05-16 MED ORDER — ALPRAZOLAM 0.5 MG PO TABS: ORAL_TABLET | ORAL | 0 refills | Status: DC

## 2021-05-16 MED ORDER — SERTRALINE HCL 50 MG PO TABS: ORAL_TABLET | ORAL | 3 refills | Status: DC

## 2021-05-16 MED ORDER — ZOLPIDEM TARTRATE 5 MG PO TABS: ORAL_TABLET | ORAL | 5 refills | Status: DC

## 2021-05-16 NOTE — Progress Notes (Signed)
[name] [id number] 03-Aug-1950 71 y.o.  Subjective:   Patient ID:  [name] is a 71 y.o. (DOB [birth year]) female.  Chief Complaint:  Chief Complaint  Patient presents with   Insomnia   Follow-up    Anxiety and depression    Insomnia  [name] presents to the office today for follow-up of depression and anxiety. Denies any recent anxiety. Denies depressed mood. She reports occasional days where she will "feel a little blue," such as when she is missing her daughter. She reports that her sleep has been disrupted since time change. She reports that she will "toss and turn... I get plenty of sleep once I got to sleep." She reports that she has been out of Ambien. She reports that Ambien 2.5 mg seems to work well. Appetite has been good. She reports that she and her husband are trying to limit carbs. Energy and motivation have been ok. She reports that she was exercising regularly and felt better, and then stopped exercising over the holidays. Concentration has been adequate. Denies SI.   Daughter continues to work in [place]. Talks with her regularly. Daughter visited in August. Husband is doing ok.   Has used Xanax prn about twice in the last year.   Past Psychiatric Medication Trials: Trazodone- HA Ambien Sertraline Lexapro Klonopin Xanax    Review of Systems:  Review of Systems  Gastrointestinal: Negative.   Genitourinary: Negative.   Musculoskeletal:  Negative for gait problem.  Psychiatric/Behavioral:  The patient has insomnia.        Please refer to HPI   Medications: I have reviewed the patient's current medications.  Current Outpatient Medications  Medication Sig Dispense Refill   acetaminophen (TYLENOL) 500 MG tablet Take 1,000 mg by mouth every 6 (six) hours as needed for mild pain.     Ascorbic Acid (VITAMIN C) 100 MG tablet Take 100 mg by mouth daily.     cholecalciferol (VITAMIN D3) 25 MCG (1000 UNIT) tablet Take 1,000 Units by mouth daily.      losartan (COZAAR) 100 MG tablet Take 100 mg by mouth daily.     Omega-3 Fatty Acids (FISH OIL PO) Take by mouth.     ALPRAZolam (XANAX) 0.5 MG tablet TAKE 1 TABLET BY MOUTH EVERY DAY AS NEEDED FOR ANXIETY 10 tablet 0   pravastatin (PRAVACHOL) 20 MG tablet Take 20 mg by mouth every evening.  3   sertraline (ZOLOFT) 50 MG tablet Take 1-2 tabs po qd 180 tablet 3   zolpidem (AMBIEN) 5 MG tablet Take 1/2-1 tablet at bedtime as needed for insomnia. 30 tablet 5   No current facility-administered medications for this visit.    Medication Side Effects: None  Allergies:  Allergies  Allergen Reactions   Ace Inhibitors Other (See Comments)    Told not to take   Norvasc [Amlodipine Besylate] Other (See Comments)    metallic taste in mouth   Nsaids Other (See Comments)    Told not to take   Omnipaque [Iohexol] Other (See Comments)    Pt states not an allergy has been told not to have the dye due to a hx of renal insufficiency, was given these instructions in 2002    Past Medical History:  Diagnosis Date   Anxiety    Arthritis    Depression    Emotional stress 01/22/2016   patient fearful of postop problems stemming from earlier experience- crying and anxious during PST appt.   History of hysterectomy  Hx of cholecystectomy    Hypercholesterolemia    Hyperlipidemia    Hypertension    PONV (postoperative nausea and vomiting)    anesthesia vs Morphine   Pre-diabetes    Renal insufficiency    Shortness of breath dyspnea    from anxiety   Sleep apnea     Past Medical History, Surgical history, Social history, and Family history were reviewed and updated as appropriate.   Please see review of systems for further details on the patient's review from today.   Objective:   Physical Exam:  Wt 202 lb (91.6 kg)    BMI 31.64 kg/m   Physical Exam Constitutional:      General: She is not in acute distress. Musculoskeletal:        General: No deformity.  Neurological:     Mental  Status: She is alert and oriented to person, place, and time.     Coordination: Coordination normal.  Psychiatric:        Attention and Perception: Attention and perception normal. She does not perceive auditory or visual hallucinations.        Mood and Affect: Mood normal. Mood is not anxious or depressed. Affect is not labile, blunt, angry or inappropriate.        Speech: Speech normal.        Behavior: Behavior normal.        Thought Content: Thought content normal. Thought content is not paranoid or delusional. Thought content does not include homicidal or suicidal ideation. Thought content does not include homicidal or suicidal plan.        Cognition and Memory: Cognition and memory normal.        Judgment: Judgment normal.     Comments: Insight intact    Lab Review:     Component Value Date/Time   NA 136 01/29/2016 0540   K 4.6 01/29/2016 0540   CL 104 01/29/2016 0540   CO2 26 01/29/2016 0540   GLUCOSE 134 (H) 01/29/2016 0540   BUN 9 01/29/2016 0540   CREATININE 1.09 (H) 01/29/2016 0540   CALCIUM 8.6 (L) 01/29/2016 0540   PROT 7.1 03/23/2012 1042   ALBUMIN 4.0 03/23/2012 1042   AST 21 03/23/2012 1042   ALT 25 03/23/2012 1042   ALKPHOS 61 03/23/2012 1042   BILITOT 0.4 03/23/2012 1042   GFRNONAA 52 (L) 01/29/2016 0540   GFRAA >60 01/29/2016 0540       Component Value Date/Time   WBC 7.7 01/22/2016 1000   RBC 4.45 01/22/2016 1000   HGB 11.4 (L) 01/28/2016 0528   HCT 34.8 (L) 01/28/2016 0528   PLT 217 01/22/2016 1000   MCV 89.4 01/22/2016 1000   MCH 28.3 01/22/2016 1000   MCHC 31.7 01/22/2016 1000   RDW 13.4 01/22/2016 1000   LYMPHSABS 2.1 12/18/2015 0715   MONOABS 0.5 12/18/2015 0715   EOSABS 0.1 12/18/2015 0715   BASOSABS 0.1 12/18/2015 0715    No results found for: POCLITH, LITHIUM   No results found for: PHENYTOIN, PHENOBARB, VALPROATE, CBMZ   .res Assessment: Plan:    Patient seen for 30 minutes and time spent reviewing treatment plan.  She reports  that she would like to attempt dose reduction of sertraline.  Will decrease sertraline to 75 mg daily.  Will prescribe sertraline 50 mg 1 to 2 tablets daily so that patient may attempt dose reduction and increase sertraline if she experiences any worsening mood or anxiety signs and symptoms.   Will change Ambien to 5  mg tablet  so that she can take 1/2 tablet since she reports that typically Ambien 2.5 mg is effective for her insomnia.  She reports that she has been having difficulty cutting 10 mg tablets into quarters and we will therefore change to a 5 mg tablet. Will review alprazolam prescription to have available since previous prescription has expired. Patient to follow up in 1 year or sooner if clinically indicated. Patient advised to contact office with any questions, adverse effects, or acute worsening in signs and symptoms.    [name] was seen today for insomnia and follow-up.  Diagnoses and all orders for this visit:  Generalized anxiety disorder -     sertraline (ZOLOFT) 50 MG tablet; Take 1-2 tabs po qd -     ALPRAZolam (XANAX) 0.5 MG tablet; TAKE 1 TABLET BY MOUTH EVERY DAY AS NEEDED FOR ANXIETY  Insomnia, unspecified type -     zolpidem (AMBIEN) 5 MG tablet; Take 1/2-1 tablet at bedtime as needed for insomnia.     Please see After Visit Summary for patient specific instructions.  Future Appointments  Date Time Provider [place]  07/21/2021  2:30 PM Hayden Pedro, MD [facility] None  05/15/2022 12:45 PM Thayer Headings, PMHNP [facility] None    No orders of the defined types were placed in this encounter.   -------------------------------

## 2021-05-28 DIAGNOSIS — F329 Major depressive disorder, single episode, unspecified: Secondary | ICD-10-CM | POA: Diagnosis not present

## 2021-05-28 DIAGNOSIS — E6609 Other obesity due to excess calories: Secondary | ICD-10-CM | POA: Diagnosis not present

## 2021-05-28 DIAGNOSIS — Z6831 Body mass index (BMI) 31.0-31.9, adult: Secondary | ICD-10-CM | POA: Diagnosis not present

## 2021-05-28 DIAGNOSIS — E538 Deficiency of other specified B group vitamins: Secondary | ICD-10-CM | POA: Diagnosis not present

## 2021-05-28 DIAGNOSIS — E559 Vitamin D deficiency, unspecified: Secondary | ICD-10-CM | POA: Diagnosis not present

## 2021-05-28 DIAGNOSIS — Z1331 Encounter for screening for depression: Secondary | ICD-10-CM | POA: Diagnosis not present

## 2021-05-28 DIAGNOSIS — F419 Anxiety disorder, unspecified: Secondary | ICD-10-CM | POA: Diagnosis not present

## 2021-05-28 DIAGNOSIS — Z0000 Encounter for general adult medical examination without abnormal findings: Secondary | ICD-10-CM | POA: Diagnosis not present

## 2021-05-28 DIAGNOSIS — E119 Type 2 diabetes mellitus without complications: Secondary | ICD-10-CM | POA: Diagnosis not present

## 2021-05-28 DIAGNOSIS — I10 Essential (primary) hypertension: Secondary | ICD-10-CM | POA: Diagnosis not present

## 2021-05-28 DIAGNOSIS — E782 Mixed hyperlipidemia: Secondary | ICD-10-CM | POA: Diagnosis not present

## 2021-05-28 DIAGNOSIS — Z0001 Encounter for general adult medical examination with abnormal findings: Secondary | ICD-10-CM | POA: Diagnosis not present

## 2021-06-02 DIAGNOSIS — E875 Hyperkalemia: Secondary | ICD-10-CM | POA: Diagnosis not present

## 2021-06-15 DIAGNOSIS — G4730 Sleep apnea, unspecified: Secondary | ICD-10-CM | POA: Diagnosis not present

## 2021-06-27 DIAGNOSIS — G4733 Obstructive sleep apnea (adult) (pediatric): Secondary | ICD-10-CM | POA: Diagnosis not present

## 2021-07-04 DIAGNOSIS — Z20822 Contact with and (suspected) exposure to covid-19: Secondary | ICD-10-CM | POA: Diagnosis not present

## 2021-07-17 DIAGNOSIS — I10 Essential (primary) hypertension: Secondary | ICD-10-CM | POA: Diagnosis not present

## 2021-07-17 DIAGNOSIS — G4733 Obstructive sleep apnea (adult) (pediatric): Secondary | ICD-10-CM | POA: Diagnosis not present

## 2021-07-17 DIAGNOSIS — E6609 Other obesity due to excess calories: Secondary | ICD-10-CM | POA: Diagnosis not present

## 2021-07-17 DIAGNOSIS — Z6831 Body mass index (BMI) 31.0-31.9, adult: Secondary | ICD-10-CM | POA: Diagnosis not present

## 2021-07-17 DIAGNOSIS — L309 Dermatitis, unspecified: Secondary | ICD-10-CM | POA: Diagnosis not present

## 2021-07-19 DIAGNOSIS — Z20828 Contact with and (suspected) exposure to other viral communicable diseases: Secondary | ICD-10-CM | POA: Diagnosis not present

## 2021-07-21 ENCOUNTER — Other Ambulatory Visit: Payer: Self-pay

## 2021-07-21 ENCOUNTER — Encounter (INDEPENDENT_AMBULATORY_CARE_PROVIDER_SITE_OTHER): Payer: Medicare Other | Admitting: Ophthalmology

## 2021-07-21 DIAGNOSIS — H43813 Vitreous degeneration, bilateral: Secondary | ICD-10-CM

## 2021-07-21 DIAGNOSIS — H35033 Hypertensive retinopathy, bilateral: Secondary | ICD-10-CM | POA: Diagnosis not present

## 2021-07-21 DIAGNOSIS — H35372 Puckering of macula, left eye: Secondary | ICD-10-CM

## 2021-07-21 DIAGNOSIS — I10 Essential (primary) hypertension: Secondary | ICD-10-CM

## 2021-07-23 DIAGNOSIS — Z20822 Contact with and (suspected) exposure to covid-19: Secondary | ICD-10-CM | POA: Diagnosis not present

## 2021-08-02 DIAGNOSIS — Z20828 Contact with and (suspected) exposure to other viral communicable diseases: Secondary | ICD-10-CM | POA: Diagnosis not present

## 2021-08-20 DIAGNOSIS — H3589 Other specified retinal disorders: Secondary | ICD-10-CM | POA: Diagnosis not present

## 2021-08-27 DIAGNOSIS — Z20822 Contact with and (suspected) exposure to covid-19: Secondary | ICD-10-CM | POA: Diagnosis not present

## 2021-09-03 DIAGNOSIS — Z20822 Contact with and (suspected) exposure to covid-19: Secondary | ICD-10-CM | POA: Diagnosis not present

## 2021-09-04 DIAGNOSIS — Z20822 Contact with and (suspected) exposure to covid-19: Secondary | ICD-10-CM | POA: Diagnosis not present

## 2021-09-08 DIAGNOSIS — Z20822 Contact with and (suspected) exposure to covid-19: Secondary | ICD-10-CM | POA: Diagnosis not present

## 2021-11-13 ENCOUNTER — Encounter: Payer: Self-pay | Admitting: Psychiatry

## 2021-11-13 ENCOUNTER — Ambulatory Visit (INDEPENDENT_AMBULATORY_CARE_PROVIDER_SITE_OTHER): Payer: Medicare Other | Admitting: Psychiatry

## 2021-11-13 DIAGNOSIS — F411 Generalized anxiety disorder: Secondary | ICD-10-CM | POA: Diagnosis not present

## 2021-11-13 DIAGNOSIS — G4700 Insomnia, unspecified: Secondary | ICD-10-CM

## 2021-11-13 MED ORDER — ZOLPIDEM TARTRATE 5 MG PO TABS: ORAL_TABLET | ORAL | 5 refills | Status: DC

## 2021-11-13 MED ORDER — RAMELTEON 8 MG PO TABS: 8.0000 mg | ORAL_TABLET | Freq: Every day | ORAL | 0 refills | Status: DC

## 2021-11-13 MED ORDER — ALPRAZOLAM 0.5 MG PO TABS: ORAL_TABLET | ORAL | 0 refills | Status: DC

## 2021-11-13 NOTE — Progress Notes (Signed)
[name] [id number] [birth year] 71 y.o.  Subjective:   Patient ID:  [name] is a 71 y.o. (DOB 07-Feb-1951) female.  Chief Complaint:  Chief Complaint  Patient presents with   Panic Attack   Insomnia    HPI [name] presents to the office emergently today for panic, anxiety, and insomnia.  She reports that in April she decided to decrease Sertraline to 75 mg for 2 weeks, 50 mg for 2 weeks and then 25 mg. "I did pretty good for a couple of weeks" and then started having panic attacks. She had to use Xanax prn. She started having severe insomnia and went 2 days without sleeping.   She reports that she re-started Sertraline 25 mg about 2 weeks ago and notices a slight improvement. She reports that she continues to have high anxiety and will feel on the verge of panic when she tries to sleep. She has been having panic attacks about 4 times a week. She reports that she is still having difficulty sleeping. She reports that she has not been having depression. She reports that she has been having uncontrolled crying. She recently had anxiety in a restaurant and felt like everything was very loud and she had to leave. Energy and motivation have been ok. She felt like her energy was better on lower doses of Sertraline. Appetite has been ok. She reports that concentration has been ok. Denies anhedonia. Denies SI.   Denies any acute psychosocial stressors.   Past Psychiatric Medication Trials: Trazodone- HA Ambien Sertraline Lexapro Klonopin Xanax    Review of Systems:  Review of Systems  Musculoskeletal:  Negative for gait problem.  Neurological:  Negative for tremors.  Psychiatric/Behavioral:         Please refer to HPI    Medications: I have reviewed the patient's current medications.  Current Outpatient Medications  Medication Sig Dispense Refill   Ascorbic Acid (VITAMIN C) 100 MG tablet Take 100 mg by mouth daily.     cholecalciferol (VITAMIN D3) 25 MCG (1000 UNIT)  tablet Take 1,000 Units by mouth daily.     hydrochlorothiazide (HYDRODIURIL) 25 MG tablet Take 25 mg by mouth daily.     losartan (COZAAR) 100 MG tablet Take 100 mg by mouth daily.     Omega-3 Fatty Acids (FISH OIL PO) Take by mouth.     pravastatin (PRAVACHOL) 20 MG tablet Take 20 mg by mouth every evening.  3   ramelteon (ROZEREM) 8 MG tablet Take 1 tablet (8 mg total) by mouth at bedtime. 90 tablet 0   sertraline (ZOLOFT) 50 MG tablet Take 1-2 tabs po qd (Patient taking differently: Take 25 mg by mouth daily. Take 1-2 tabs po qd) 180 tablet 3   acetaminophen (TYLENOL) 500 MG tablet Take 1,000 mg by mouth every 6 (six) hours as needed for mild pain.     ALPRAZolam (XANAX) 0.5 MG tablet TAKE 1 TABLET BY MOUTH EVERY DAY AS NEEDED FOR ANXIETY 30 tablet 0   zolpidem (AMBIEN) 5 MG tablet Take 1/2-1 tablet at bedtime as needed for insomnia. 30 tablet 5   No current facility-administered medications for this visit.    Medication Side Effects: None  Allergies:  Allergies  Allergen Reactions   Ace Inhibitors Other (See Comments)    Told not to take   Norvasc [Amlodipine Besylate] Other (See Comments)    metallic taste in mouth   Nsaids Other (See Comments)    Told not to take   Omnipaque [  Iohexol] Other (See Comments)    Pt states not an allergy has been told not to have the dye due to a hx of renal insufficiency, was given these instructions in 2002    Past Medical History:  Diagnosis Date   Anxiety    Arthritis    Depression    Emotional stress 01/22/2016   patient fearful of postop problems stemming from earlier experience- crying and anxious during PST appt.   History of hysterectomy    Hx of cholecystectomy    Hypercholesterolemia    Hyperlipidemia    Hypertension    PONV (postoperative nausea and vomiting)    anesthesia vs Morphine   Pre-diabetes    Renal insufficiency    Shortness of breath dyspnea    from anxiety   Sleep apnea     Past Medical History, Surgical  history, Social history, and Family history were reviewed and updated as appropriate.   Please see review of systems for further details on the patient's review from today.   Objective:   Physical Exam:  There were no vitals taken for this visit.  Physical Exam Constitutional:      General: She is not in acute distress. Musculoskeletal:        General: No deformity.  Neurological:     Mental Status: She is alert and oriented to person, place, and time.     Coordination: Coordination normal.  Psychiatric:        Attention and Perception: Attention and perception normal. She does not perceive auditory or visual hallucinations.        Mood and Affect: Mood is anxious. Mood is not depressed. Affect is not labile, blunt, angry or inappropriate.        Speech: Speech normal.        Behavior: Behavior normal.        Thought Content: Thought content normal. Thought content is not paranoid or delusional. Thought content does not include homicidal or suicidal ideation. Thought content does not include homicidal or suicidal plan.        Cognition and Memory: Cognition and memory normal.        Judgment: Judgment normal.     Comments: Insight intact     Lab Review:     Component Value Date/Time   NA 136 01/29/2016 0540   K 4.6 01/29/2016 0540   CL 104 01/29/2016 0540   CO2 26 01/29/2016 0540   GLUCOSE 134 (H) 01/29/2016 0540   BUN 9 01/29/2016 0540   CREATININE 1.09 (H) 01/29/2016 0540   CALCIUM 8.6 (L) 01/29/2016 0540   PROT 7.1 03/23/2012 1042   ALBUMIN 4.0 03/23/2012 1042   AST 21 03/23/2012 1042   ALT 25 03/23/2012 1042   ALKPHOS 61 03/23/2012 1042   BILITOT 0.4 03/23/2012 1042   GFRNONAA 52 (L) 01/29/2016 0540   GFRAA >60 01/29/2016 0540       Component Value Date/Time   WBC 7.7 01/22/2016 1000   RBC 4.45 01/22/2016 1000   HGB 11.4 (L) 01/28/2016 0528   HCT 34.8 (L) 01/28/2016 0528   PLT 217 01/22/2016 1000   MCV 89.4 01/22/2016 1000   MCH 28.3 01/22/2016 1000    MCHC 31.7 01/22/2016 1000   RDW 13.4 01/22/2016 1000   LYMPHSABS 2.1 12/18/2015 0715   MONOABS 0.5 12/18/2015 0715   EOSABS 0.1 12/18/2015 0715   BASOSABS 0.1 12/18/2015 0715    No results found for: "POCLITH", "LITHIUM"   No results found for: "PHENYTOIN", "PHENOBARB", "VALPROATE", "  CBMZ"   .res Assessment: Plan:    Patient seen for 30 minutes and time spent discussing recent acute worsening in her panic symptoms.  Discussed that increasing sertraline would likely be helpful for panic and also for insomnia since this is likely a symptom of her anxiety.  She reports that she would like to use the lowest possible effective dose of sertraline and prefers to gradually increase sertraline.Will increase Sertraline to 50 mg.  Recommended increasing sertraline to 75 mg daily if she continues to have panic symptoms. She asks about a trial of Rozerem to improve insomnia and to use in place of Ambien.  Will send in prescription for Rozerem 8 mg at bedtime for insomnia.  Advised patient to contact office if insomnia does not improve. Will continue alprazolam 0.5 mg daily as needed for anxiety. Patient to follow-up in January as previously scheduled or sooner if clinically indicated. Patient advised to contact office with any questions, adverse effects, or acute worsening in signs and symptoms.  [name] was seen today for panic attack and insomnia.  Diagnoses and all orders for this visit:  Insomnia, unspecified type -     ramelteon (ROZEREM) 8 MG tablet; Take 1 tablet (8 mg total) by mouth at bedtime. -     zolpidem (AMBIEN) 5 MG tablet; Take 1/2-1 tablet at bedtime as needed for insomnia.  Generalized anxiety disorder -     ALPRAZolam (XANAX) 0.5 MG tablet; TAKE 1 TABLET BY MOUTH EVERY DAY AS NEEDED FOR ANXIETY     Please see After Visit Summary for patient specific instructions.  Future Appointments  Date Time Provider [place]  05/11/2022 12:15 PM Hayden Pedro, MD [facility] None   05/15/2022 12:45 PM Thayer Headings, PMHNP [facility] None    No orders of the defined types were placed in this encounter.   -------------------------------

## 2021-12-29 ENCOUNTER — Other Ambulatory Visit: Payer: Self-pay | Admitting: Internal Medicine

## 2021-12-29 DIAGNOSIS — Z1231 Encounter for screening mammogram for malignant neoplasm of breast: Secondary | ICD-10-CM

## 2022-01-01 ENCOUNTER — Ambulatory Visit
Admission: RE | Admit: 2022-01-01 | Discharge: 2022-01-01 | Disposition: A | Discharge status: Home / Self Care | Payer: Medicare Other | Source: Ambulatory Visit | Attending: Internal Medicine | Admitting: Internal Medicine

## 2022-01-01 DIAGNOSIS — Z1231 Encounter for screening mammogram for malignant neoplasm of breast: Secondary | ICD-10-CM | POA: Diagnosis not present

## 2022-02-23 DIAGNOSIS — U071 COVID-19: Secondary | ICD-10-CM | POA: Diagnosis not present

## 2022-03-28 DIAGNOSIS — Z23 Encounter for immunization: Secondary | ICD-10-CM | POA: Diagnosis not present

## 2022-03-31 DIAGNOSIS — I10 Essential (primary) hypertension: Secondary | ICD-10-CM | POA: Diagnosis not present

## 2022-03-31 DIAGNOSIS — F419 Anxiety disorder, unspecified: Secondary | ICD-10-CM | POA: Diagnosis not present

## 2022-03-31 DIAGNOSIS — E6609 Other obesity due to excess calories: Secondary | ICD-10-CM | POA: Diagnosis not present

## 2022-03-31 DIAGNOSIS — E119 Type 2 diabetes mellitus without complications: Secondary | ICD-10-CM | POA: Diagnosis not present

## 2022-03-31 DIAGNOSIS — Z6831 Body mass index (BMI) 31.0-31.9, adult: Secondary | ICD-10-CM | POA: Diagnosis not present

## 2022-05-11 ENCOUNTER — Encounter (INDEPENDENT_AMBULATORY_CARE_PROVIDER_SITE_OTHER): Payer: Medicare Other | Admitting: Ophthalmology

## 2022-05-11 DIAGNOSIS — H35372 Puckering of macula, left eye: Secondary | ICD-10-CM

## 2022-05-11 DIAGNOSIS — I10 Essential (primary) hypertension: Secondary | ICD-10-CM

## 2022-05-11 DIAGNOSIS — H35033 Hypertensive retinopathy, bilateral: Secondary | ICD-10-CM

## 2022-05-11 DIAGNOSIS — H43813 Vitreous degeneration, bilateral: Secondary | ICD-10-CM

## 2022-05-15 ENCOUNTER — Encounter: Payer: Self-pay | Admitting: Psychiatry

## 2022-05-15 ENCOUNTER — Ambulatory Visit (INDEPENDENT_AMBULATORY_CARE_PROVIDER_SITE_OTHER): Payer: Medicare Other | Admitting: Psychiatry

## 2022-05-15 DIAGNOSIS — G4700 Insomnia, unspecified: Secondary | ICD-10-CM

## 2022-05-15 DIAGNOSIS — F411 Generalized anxiety disorder: Secondary | ICD-10-CM

## 2022-05-15 MED ORDER — ZOLPIDEM TARTRATE 5 MG PO TABS: ORAL_TABLET | ORAL | 5 refills | Status: DC

## 2022-05-15 MED ORDER — ALPRAZOLAM 0.5 MG PO TABS: ORAL_TABLET | ORAL | 0 refills | Status: DC

## 2022-05-15 MED ORDER — SERTRALINE HCL 50 MG PO TABS: ORAL_TABLET | ORAL | 3 refills | Status: DC

## 2022-05-15 NOTE — Progress Notes (Unsigned)
Virtual Visit via Telephone Note  I connected with [name] on 05/15/22 at 12:45 PM EST by telephone and verified that I am speaking with the correct person using two identifiers.  Location: Patient: Home  Provider: [facility]    I discussed the assessment and treatment plan with the patient. The patient was provided an opportunity to ask questions and all were answered. The patient agreed with the plan and demonstrated an understanding of the instructions.   The patient was advised to call back or seek an in-person evaluation if the symptoms worsen or if the condition fails to improve as anticipated.  I provided 27 minutes of non-face-to-face time during this encounter.   [name], PMHNP   [name] [id number] December 14, 1950 72 y.o.  Subjective:   Patient ID:  [name] is a 72 y.o. (DOB 1950/09/03) female.  Chief Complaint:  Chief Complaint  Patient presents with   Follow-up    H/o anxiety and panic attacks    HPI [name] presents to the office today for follow-up of panic, anxiety, and insomnia. She reports that she has been staying on Sertraline 50 mg and "doing well." She denies any recent panic attacks. She reports that she has had worry on a few occasions. Learned her 35 yo daughter-in-law has breast cancer and will have surgery later this month. Daughter-in-law had chemotherapy and will likely need radiation. She reports, "it's turned out better than we thought." [place] has been helping them through this with helping with 1 yo granddaughter. She denies depressed mood. "It's been a good year." She reports that the time change altered her sleep. She had to change sleep mask on cPap and adjusting to the mask has caused some sleep disturbance. She reports getting an adequate amount of sleep overall. She reports occasionally takes Ambien 5 mg 1/2 tab when she has middle of the night awakening. Appetite has been "too good" over the holidays. She  reports that her energy and motivation have been good as long as the weather is not too cold. She reports adequate concentration. Denies SI.   She reports Rozerem was helpful for sleep, however she noticed a headache upon awakening the 3 nights she took them. She reports that this coincided with starting increase Sertraline.  She has not taken Alprazolam prn recently.   Her daughter is doing well in [place] and is working virtually.    Ambien last filled 05/02/22. Alprazolam last filled 11/13/21.  Past Psychiatric Medication Trials: Trazodone- HA Ambien Sertraline Lexapro Klonopin Xanax Rozerem  Review of Systems:  Review of Systems  Musculoskeletal:  Negative for gait problem.  Neurological:  Negative for headaches.  Psychiatric/Behavioral:         Please refer to HPI    Medications: I have reviewed the patient's current medications.  Current Outpatient Medications  Medication Sig Dispense Refill   acetaminophen (TYLENOL) 500 MG tablet Take 1,000 mg by mouth every 6 (six) hours as needed for mild pain.     cholecalciferol (VITAMIN D3) 25 MCG (1000 UNIT) tablet Take 1,000 Units by mouth daily.     hydrochlorothiazide (HYDRODIURIL) 25 MG tablet Take 25 mg by mouth daily.     losartan (COZAAR) 100 MG tablet Take 100 mg by mouth daily.     Omega-3 Fatty Acids (FISH OIL PO) Take by mouth.     pravastatin (PRAVACHOL) 20 MG tablet Take 20 mg by mouth every evening.  3   ALPRAZolam (XANAX) 0.5 MG tablet TAKE  1 TABLET BY MOUTH EVERY DAY AS NEEDED FOR ANXIETY 30 tablet 0   sertraline (ZOLOFT) 50 MG tablet Take 1-2 tabs po qd 180 tablet 3   [START ON 05/30/2022] zolpidem (AMBIEN) 5 MG tablet Take 1/2-1 tablet at bedtime as needed for insomnia. 30 tablet 5   No current facility-administered medications for this visit.    Medication Side Effects: None  Allergies:  Allergies  Allergen Reactions   Ace Inhibitors Other (See Comments)    Told not to take   Norvasc [Amlodipine  Besylate] Other (See Comments)    metallic taste in mouth   Nsaids Other (See Comments)    Told not to take   Omnipaque [Iohexol] Other (See Comments)    Pt states not an allergy has been told not to have the dye due to a hx of renal insufficiency, was given these instructions in 2002    Past Medical History:  Diagnosis Date   Anxiety    Arthritis    Depression    Emotional stress 01/22/2016   patient fearful of postop problems stemming from earlier experience- crying and anxious during PST appt.   History of hysterectomy    Hx of cholecystectomy    Hypercholesterolemia    Hyperlipidemia    Hypertension    PONV (postoperative nausea and vomiting)    anesthesia vs Morphine   Pre-diabetes    Renal insufficiency    Shortness of breath dyspnea    from anxiety   Sleep apnea     Past Medical History, Surgical history, Social history, and Family history were reviewed and updated as appropriate.   Please see review of systems for further details on the patient's review from today.   Objective:   Physical Exam:  There were no vitals taken for this visit.  Physical Exam Neurological:     Mental Status: She is alert and oriented to person, place, and time.     Cranial Nerves: No dysarthria.  Psychiatric:        Attention and Perception: Attention and perception normal.        Mood and Affect: Mood normal.        Speech: Speech normal.        Behavior: Behavior is cooperative.        Thought Content: Thought content normal. Thought content is not paranoid or delusional. Thought content does not include homicidal or suicidal ideation. Thought content does not include homicidal or suicidal plan.        Cognition and Memory: Cognition and memory normal.        Judgment: Judgment normal.     Comments: Insight intact    Lab Review:     Component Value Date/Time   NA 136 01/29/2016 0540   K 4.6 01/29/2016 0540   CL 104 01/29/2016 0540   CO2 26 01/29/2016 0540   GLUCOSE 134  (H) 01/29/2016 0540   BUN 9 01/29/2016 0540   CREATININE 1.09 (H) 01/29/2016 0540   CALCIUM 8.6 (L) 01/29/2016 0540   PROT 7.1 03/23/2012 1042   ALBUMIN 4.0 03/23/2012 1042   AST 21 03/23/2012 1042   ALT 25 03/23/2012 1042   ALKPHOS 61 03/23/2012 1042   BILITOT 0.4 03/23/2012 1042   GFRNONAA 52 (L) 01/29/2016 0540   GFRAA >60 01/29/2016 0540       Component Value Date/Time   WBC 7.7 01/22/2016 1000   RBC 4.45 01/22/2016 1000   HGB 11.4 (L) 01/28/2016 0528   HCT 34.8 (L) 01/28/2016  0528   PLT 217 01/22/2016 1000   MCV 89.4 01/22/2016 1000   MCH 28.3 01/22/2016 1000   MCHC 31.7 01/22/2016 1000   RDW 13.4 01/22/2016 1000   LYMPHSABS 2.1 12/18/2015 0715   MONOABS 0.5 12/18/2015 0715   EOSABS 0.1 12/18/2015 0715   BASOSABS 0.1 12/18/2015 0715    No results found for: "POCLITH", "LITHIUM"   No results found for: "PHENYTOIN", "PHENOBARB", "VALPROATE", "CBMZ"   .res Assessment: Plan:   Will continue Sertraline 50 mg daily with option to increase dose up to 100 mg daily since pt has periodically needed to increase dose to 100 mg daily to control panic.  She reports that she has not used Alprazolam prn recently. Will send script to be put on file.  She reports that Rozerem was very effective for her insomnia, however she awakened with a headache and stopped it after 3 days. Discussed that headache may have been related to re-starting and increasing Sertraline at the time since she has had headache other times for a few days after she has started and increased Sertraline. Pt reports that she may re-try Rozerem. Advised pt to call office if she retries Rozerem and does not experience a headache and would like an additional script sent for Rozerem.  Will continue Ambien 5 mg 1/2-1 tab po QHS prn insomnia.  Pt to follow-up in one year or sooner if clinically indicated. Patient advised to contact office with any questions, adverse effects, or acute worsening in signs and  symptoms.   Malina was seen today for follow-up.  Diagnoses and all orders for this visit:  Generalized anxiety disorder -     ALPRAZolam (XANAX) 0.5 MG tablet; TAKE 1 TABLET BY MOUTH EVERY DAY AS NEEDED FOR ANXIETY -     sertraline (ZOLOFT) 50 MG tablet; Take 1-2 tabs po qd  Insomnia, unspecified type -     zolpidem (AMBIEN) 5 MG tablet; Take 1/2-1 tablet at bedtime as needed for insomnia.     Please see After Visit Summary for patient specific instructions.  No future appointments.   No orders of the defined types were placed in this encounter.   -------------------------------

## 2022-09-15 DIAGNOSIS — M25511 Pain in right shoulder: Secondary | ICD-10-CM | POA: Diagnosis not present

## 2022-12-01 ENCOUNTER — Other Ambulatory Visit: Payer: Self-pay

## 2022-12-01 DIAGNOSIS — G4700 Insomnia, unspecified: Secondary | ICD-10-CM

## 2022-12-02 MED ORDER — ZOLPIDEM TARTRATE 5 MG PO TABS: ORAL_TABLET | ORAL | 5 refills | Status: DC

## 2023-03-04 DIAGNOSIS — Z23 Encounter for immunization: Secondary | ICD-10-CM | POA: Diagnosis not present

## 2023-03-17 ENCOUNTER — Encounter: Payer: Self-pay | Admitting: Psychiatry

## 2023-04-19 ENCOUNTER — Encounter: Payer: Self-pay | Admitting: Psychiatry

## 2023-04-19 ENCOUNTER — Ambulatory Visit (INDEPENDENT_AMBULATORY_CARE_PROVIDER_SITE_OTHER): Payer: Medicare Other | Admitting: Psychiatry

## 2023-04-19 DIAGNOSIS — F411 Generalized anxiety disorder: Secondary | ICD-10-CM

## 2023-04-19 DIAGNOSIS — G4700 Insomnia, unspecified: Secondary | ICD-10-CM | POA: Diagnosis not present

## 2023-04-19 MED ORDER — RAMELTEON 8 MG PO TABS: 8.0000 mg | ORAL_TABLET | Freq: Every evening | ORAL | 3 refills | Status: AC | PRN

## 2023-04-19 MED ORDER — ZOLPIDEM TARTRATE 5 MG PO TABS: ORAL_TABLET | ORAL | 5 refills | Status: AC

## 2023-04-19 MED ORDER — RAMELTEON 8 MG PO TABS: 8.0000 mg | ORAL_TABLET | Freq: Every day | ORAL | 3 refills | Status: AC

## 2023-04-19 MED ORDER — ALPRAZOLAM 0.5 MG PO TABS: ORAL_TABLET | ORAL | 0 refills | Status: AC

## 2023-04-19 MED ORDER — SERTRALINE HCL 50 MG PO TABS: ORAL_TABLET | ORAL | 3 refills | Status: AC

## 2023-04-19 NOTE — Progress Notes (Signed)
[name] [id number] March 02, 1951 72 y.o.  Subjective:   Patient ID:  [name] is a 72 y.o. (DOB 1950/05/22) female.  Chief Complaint:  Chief Complaint  Patient presents with   Follow-up    Anxiety, depression, and insomnia    HPI [name] presents to the office today for follow-up of anxiety, depression, and insomnia. She reports that her older sister passed away in 09-22-2022 and they were really close. She reports that the holidays have been difficult with losing 4 people in the last 5 years. She reports that she has some sadness related to losses. Denies persistent sad mood. She report that her anxiety has been ok. She reports "taking maybe 3 Xanax" over the course of the year. She has been taking Rozerem at times to prevent developing a tolerance to Ambien. She takes Ambien prn a few times a week. Energy and motivation are ok. She reports that her husband likes to stay busy. Appetite has been good. Denies SI.   She is 1 of 10 children. She has a sister that is 2 years younger than her and is her only living sibling.   Daughter is still living in [place]. Daughter seems to be happy and is enjoying her job. Daughter enjoys traveling. Reports that her son prefers to stay close to home.   Ambien last filled 03/22/23 x 2.   Past Psychiatric Medication Trials: Trazodone- HA Ambien Sertraline Lexapro Klonopin Xanax Rozerem   Review of Systems:  Review of Systems  Gastrointestinal: Negative.   Musculoskeletal:  Negative for gait problem.  Neurological:  Negative for tremors.  Psychiatric/Behavioral:         Please refer to HPI    Medications: I have reviewed the patient's current medications.  Current Outpatient Medications  Medication Sig Dispense Refill   acetaminophen (TYLENOL) 500 MG tablet Take 1,000 mg by mouth every 6 (six) hours as needed for mild pain.     cholecalciferol (VITAMIN D3) 25 MCG (1000 UNIT) tablet Take 1,000 Units by mouth daily.      hydrochlorothiazide (HYDRODIURIL) 25 MG tablet Take 25 mg by mouth daily.     losartan (COZAAR) 100 MG tablet Take 100 mg by mouth daily.     Omega-3 Fatty Acids (FISH OIL PO) Take by mouth.     pravastatin (PRAVACHOL) 20 MG tablet Take 20 mg by mouth every evening.  3   ramelteon (ROZEREM) 8 MG tablet Take 1 tablet (8 mg total) by mouth at bedtime. 90 tablet 3   ALPRAZolam (XANAX) 0.5 MG tablet TAKE 1 TABLET BY MOUTH EVERY DAY AS NEEDED FOR ANXIETY 30 tablet 0   ramelteon (ROZEREM) 8 MG tablet Take 1 tablet (8 mg total) by mouth at bedtime as needed for sleep. 90 tablet 3   sertraline (ZOLOFT) 50 MG tablet Take 1-2 tabs po qd 180 tablet 3   zolpidem (AMBIEN) 5 MG tablet Take 1/2-1 tablet at bedtime as needed for insomnia. 30 tablet 5   No current facility-administered medications for this visit.    Medication Side Effects: None  Allergies:  Allergies  Allergen Reactions   Ace Inhibitors Other (See Comments)    Told not to take   Norvasc [Amlodipine Besylate] Other (See Comments)    metallic taste in mouth   Nsaids Other (See Comments)    Told not to take   Omnipaque [Iohexol] Other (See Comments)    Pt states not an allergy has been told not to have the dye due to  a hx of renal insufficiency, was given these instructions in 2002    Past Medical History:  Diagnosis Date   Anxiety    Arthritis    Depression    Emotional stress 01/22/2016   patient fearful of postop problems stemming from earlier experience- crying and anxious during PST appt.   History of hysterectomy    Hx of cholecystectomy    Hypercholesterolemia    Hyperlipidemia    Hypertension    PONV (postoperative nausea and vomiting)    anesthesia vs Morphine   Pre-diabetes    Renal insufficiency    Shortness of breath dyspnea    from anxiety   Sleep apnea     Past Medical History, Surgical history, Social history, and Family history were reviewed and updated as appropriate.   Please see review of systems  for further details on the patient's review from today.   Objective:   Physical Exam:  There were no vitals taken for this visit.  Physical Exam Constitutional:      General: She is not in acute distress. Musculoskeletal:        General: No deformity.  Neurological:     Mental Status: She is alert and oriented to person, place, and time.     Coordination: Coordination normal.  Psychiatric:        Attention and Perception: Attention and perception normal. She does not perceive auditory or visual hallucinations.        Mood and Affect: Mood normal. Mood is not anxious or depressed. Affect is not labile, blunt, angry or inappropriate.        Speech: Speech normal.        Behavior: Behavior normal.        Thought Content: Thought content normal. Thought content is not paranoid or delusional. Thought content does not include homicidal or suicidal ideation. Thought content does not include homicidal or suicidal plan.        Cognition and Memory: Cognition and memory normal.        Judgment: Judgment normal.     Comments: Insight intact     Lab Review:     Component Value Date/Time   NA 136 01/29/2016 0540   K 4.6 01/29/2016 0540   CL 104 01/29/2016 0540   CO2 26 01/29/2016 0540   GLUCOSE 134 (H) 01/29/2016 0540   BUN 9 01/29/2016 0540   CREATININE 1.09 (H) 01/29/2016 0540   CALCIUM 8.6 (L) 01/29/2016 0540   PROT 7.1 03/23/2012 1042   ALBUMIN 4.0 03/23/2012 1042   AST 21 03/23/2012 1042   ALT 25 03/23/2012 1042   ALKPHOS 61 03/23/2012 1042   BILITOT 0.4 03/23/2012 1042   GFRNONAA 52 (L) 01/29/2016 0540   GFRAA >60 01/29/2016 0540       Component Value Date/Time   WBC 7.7 01/22/2016 1000   RBC 4.45 01/22/2016 1000   HGB 11.4 (L) 01/28/2016 0528   HCT 34.8 (L) 01/28/2016 0528   PLT 217 01/22/2016 1000   MCV 89.4 01/22/2016 1000   MCH 28.3 01/22/2016 1000   MCHC 31.7 01/22/2016 1000   RDW 13.4 01/22/2016 1000   LYMPHSABS 2.1 12/18/2015 0715   MONOABS 0.5 12/18/2015  0715   EOSABS 0.1 12/18/2015 0715   BASOSABS 0.1 12/18/2015 0715    No results found for: "POCLITH", "LITHIUM"   No results found for: "PHENYTOIN", "PHENOBARB", "VALPROATE", "CBMZ"   .res Assessment: Plan:    31 minutes spent dedicated to the care of this patient on the  date of this encounter to include pre-visit review of records, ordering of medication, post visit documentation, and face-to-face time with the patient discussing transition of care to Melony Overly, PA since provider is leaving the practice. Pt reports that she would like script printed for Rozerem so that she can take script to pharmacy that has the lowest Good Rx cost when she needs it.  Will continue Rozerem 8 mg po at bedtime for insomnia.  Continue Sertraline 50 mg daily for anxiety and depression. She reports that she had worsening anxiety in the past with attempts to lower Sertraline below 50 mg daily.  Continue Ambien 5 mg 1/2-1 tablet at bedtime as needed for insomnia.  Continue Alprazolam 0.5 mg daily as needed for anxiety.  Pt to follow-up in one year or sooner if clinically indicated. Patient advised to contact office with any questions, adverse effects, or acute worsening in signs and symptoms.    [name] was seen today for follow-up.  Diagnoses and all orders for this visit:  Generalized anxiety disorder -     ALPRAZolam (XANAX) 0.5 MG tablet; TAKE 1 TABLET BY MOUTH EVERY DAY AS NEEDED FOR ANXIETY -     sertraline (ZOLOFT) 50 MG tablet; Take 1-2 tabs po qd  Insomnia, unspecified type -     ramelteon (ROZEREM) 8 MG tablet; Take 1 tablet (8 mg total) by mouth at bedtime as needed for sleep. -     ramelteon (ROZEREM) 8 MG tablet; Take 1 tablet (8 mg total) by mouth at bedtime. -     zolpidem (AMBIEN) 5 MG tablet; Take 1/2-1 tablet at bedtime as needed for insomnia.     Please see After Visit Summary for patient specific instructions.  Future Appointments  Date Time Provider Department Center   04/18/2024  2:30 PM Cherie Ouch, PA-C [facility] None    No orders of the defined types were placed in this encounter.   -------------------------------

## 2023-05-17 ENCOUNTER — Ambulatory Visit: Payer: Medicare Other | Admitting: Psychiatry

## 2023-08-11 ENCOUNTER — Other Ambulatory Visit: Payer: Self-pay | Admitting: Internal Medicine

## 2023-08-11 DIAGNOSIS — Z1231 Encounter for screening mammogram for malignant neoplasm of breast: Secondary | ICD-10-CM

## 2023-08-19 ENCOUNTER — Ambulatory Visit
Admission: RE | Admit: 2023-08-19 | Discharge: 2023-08-19 | Disposition: A | Discharge status: Home / Self Care | Source: Ambulatory Visit | Attending: Internal Medicine | Admitting: Internal Medicine

## 2023-08-19 DIAGNOSIS — Z1231 Encounter for screening mammogram for malignant neoplasm of breast: Secondary | ICD-10-CM | POA: Diagnosis not present

## 2023-09-05 IMAGING — DX DG CHEST 2V
2 series · 2 of 2 positions shown · non-contrast
Comparison: 03/15/2020, 03/15/2019

CLINICAL DATA: 70-year-old female with renal carcinoma

EXAM:
CHEST - 2 VIEW

[chest pa]
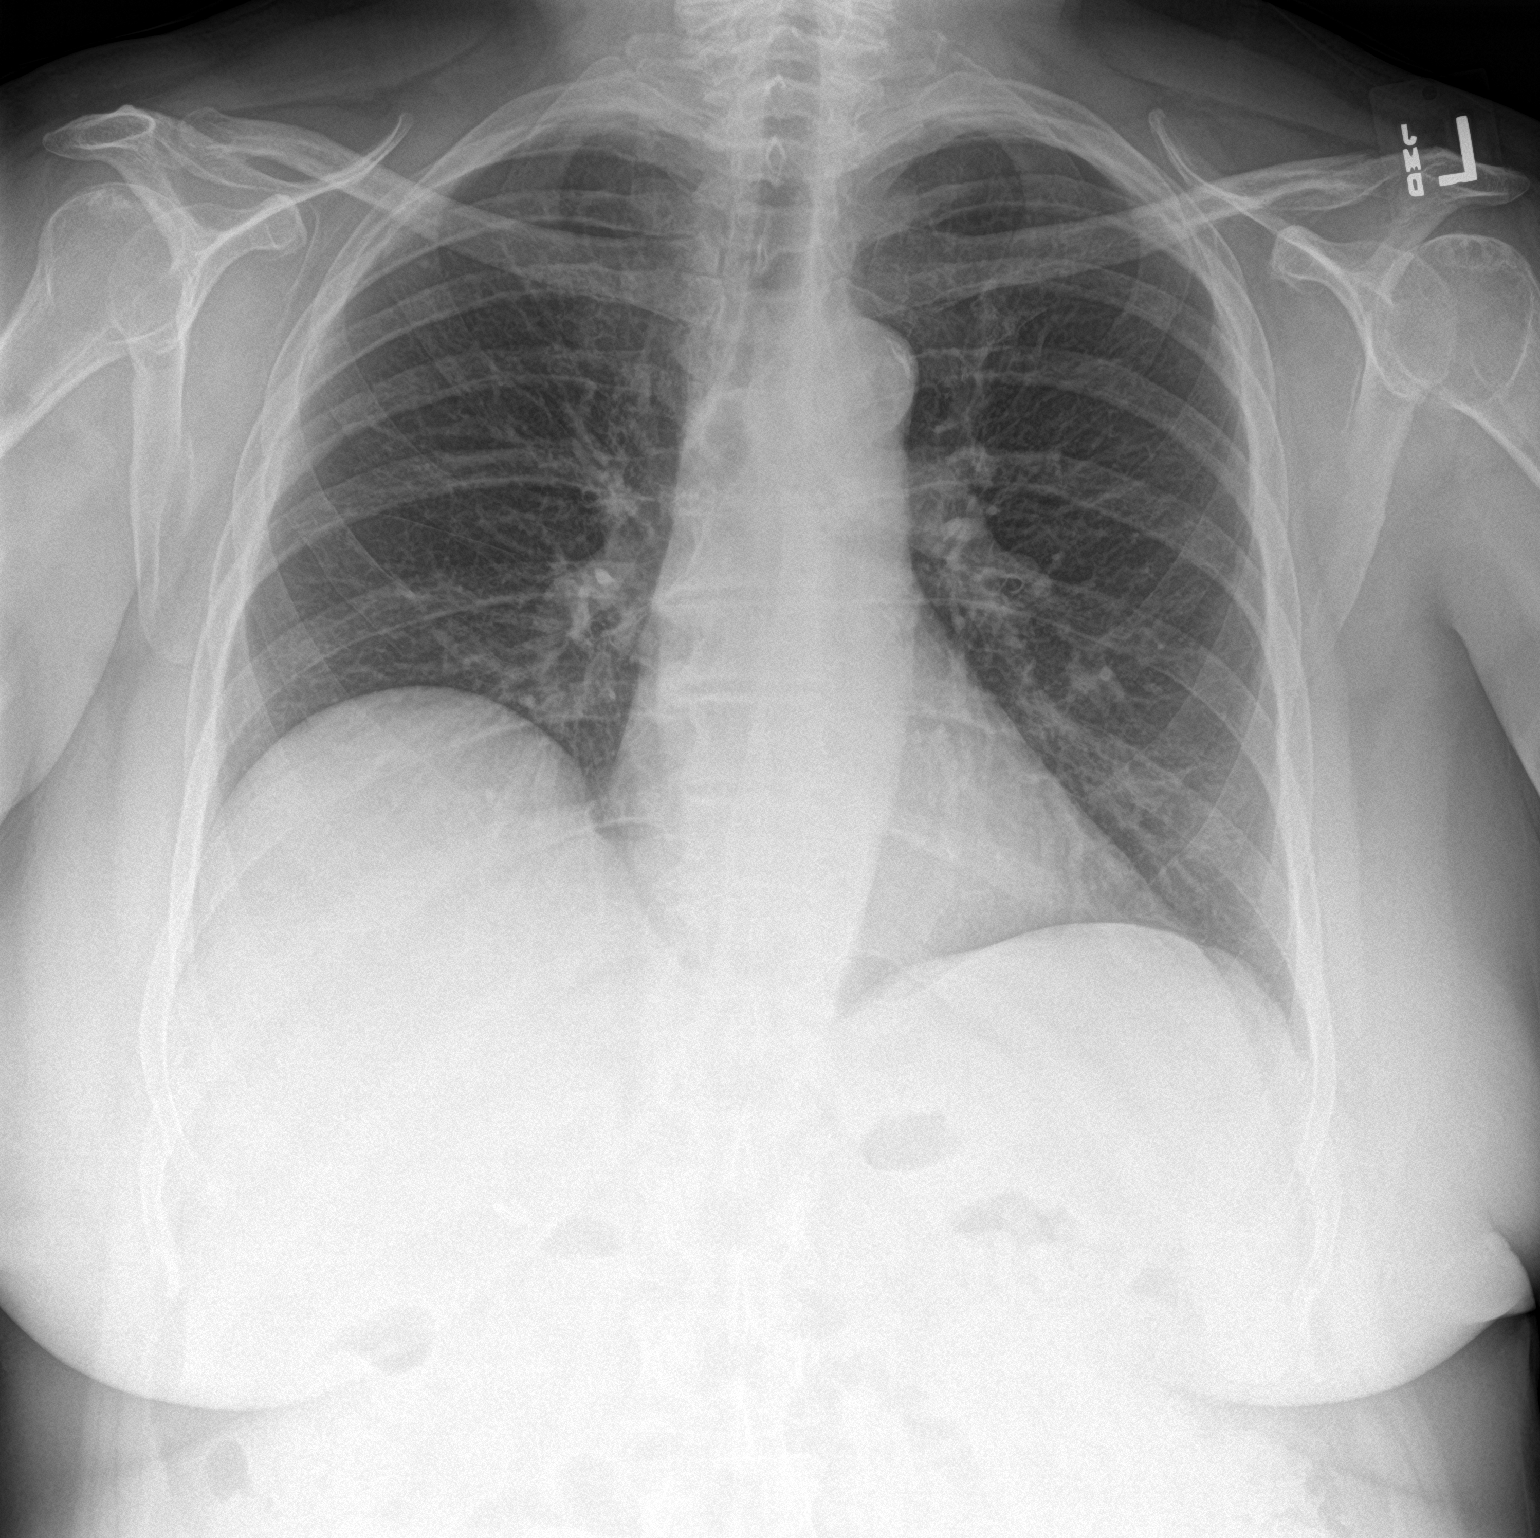

[chest lat]
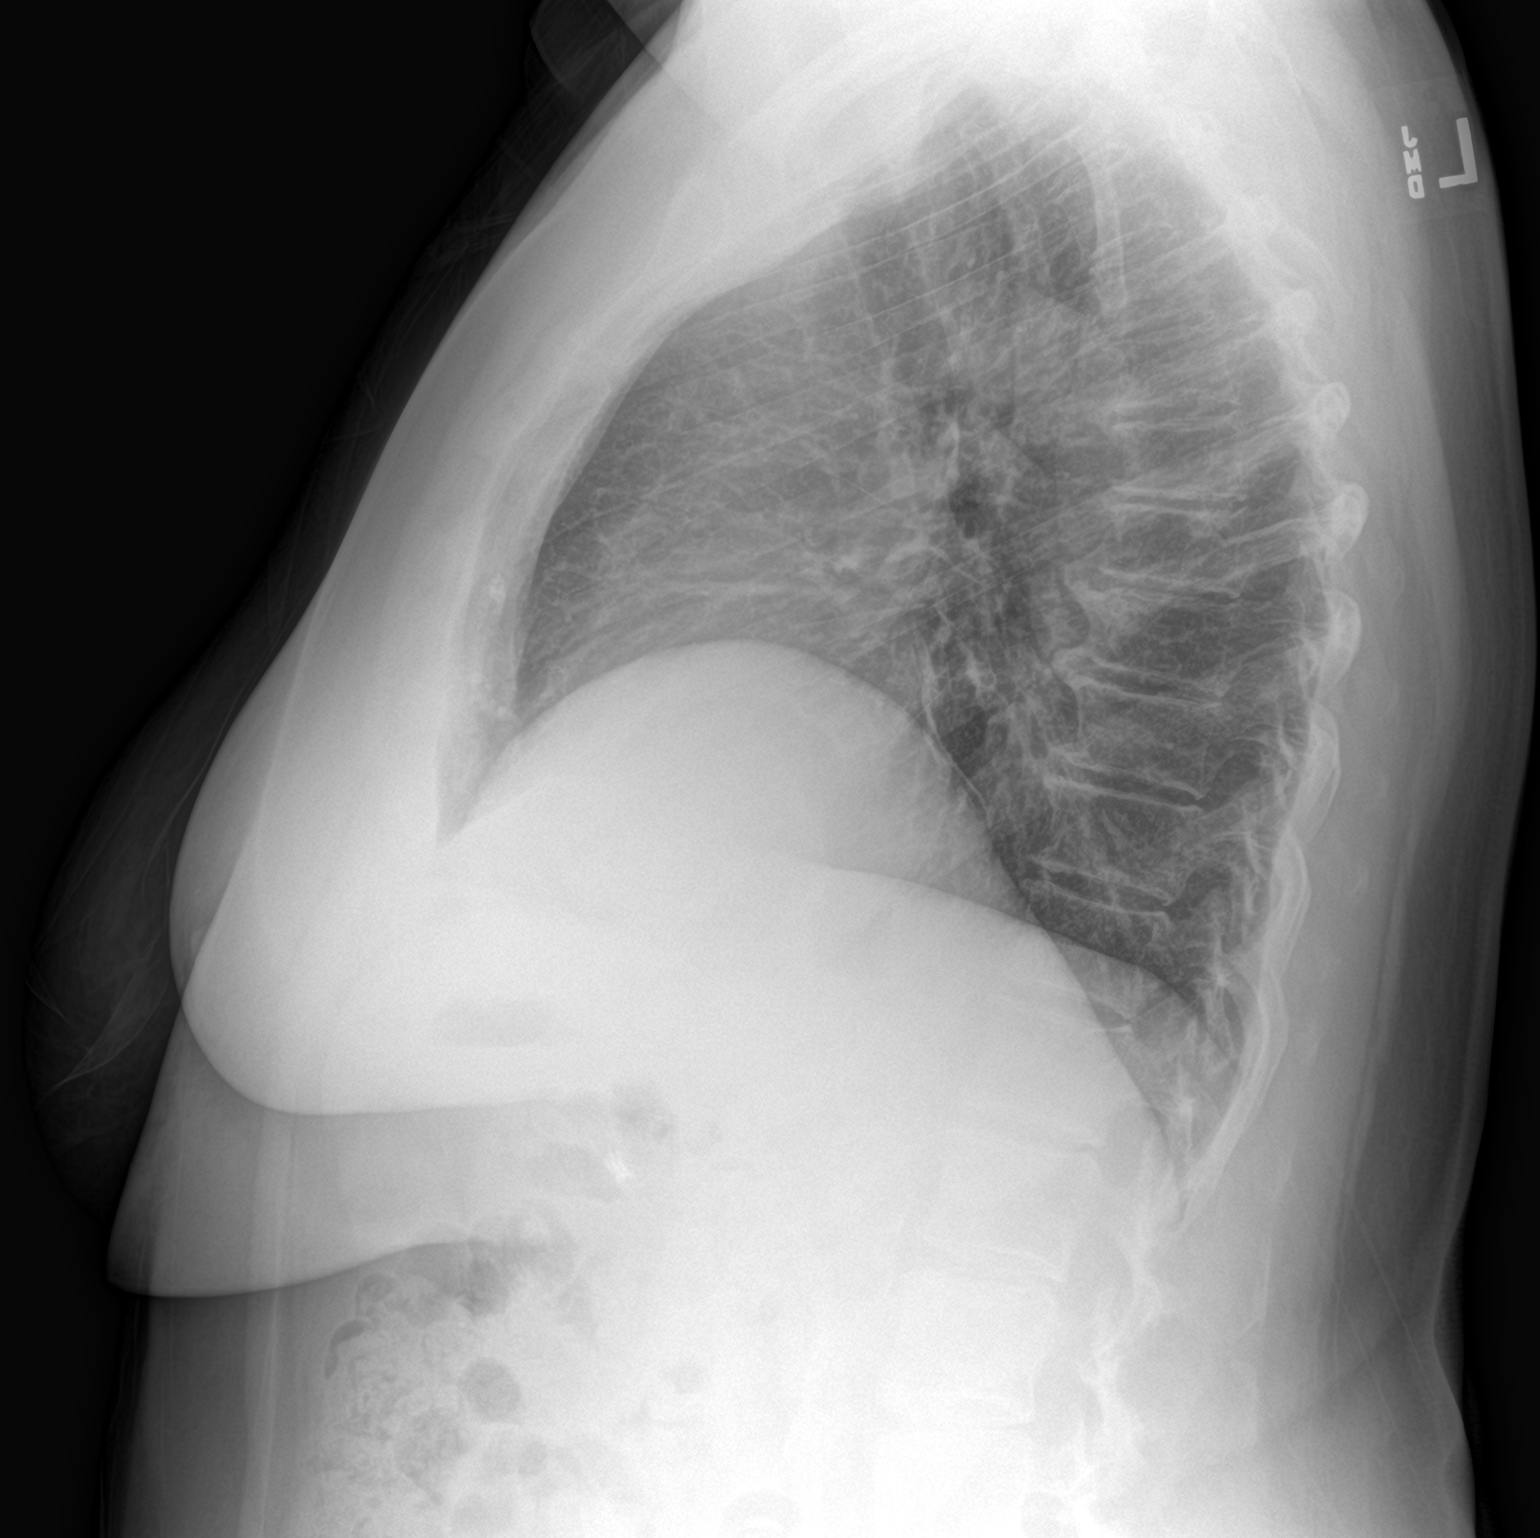

[2 of 2 positions shown; findings below may reference images not displayed]

FINDINGS: Cardiomediastinal silhouette unchanged in size and contour. No
evidence of central vascular congestion. No interlobular septal
thickening.

Similar appearance of asymmetric elevation the right hemidiaphragm.

No pneumothorax or pleural effusion. Coarsened interstitial
markings, with no confluent airspace disease.

No acute displaced fracture. Degenerative changes of the spine.
IMPRESSION: No active cardiopulmonary disease.

## 2023-10-20 ENCOUNTER — Encounter (INDEPENDENT_AMBULATORY_CARE_PROVIDER_SITE_OTHER): Admitting: Ophthalmology

## 2023-10-20 DIAGNOSIS — H35033 Hypertensive retinopathy, bilateral: Secondary | ICD-10-CM | POA: Diagnosis not present

## 2023-10-20 DIAGNOSIS — H35372 Puckering of macula, left eye: Secondary | ICD-10-CM | POA: Diagnosis not present

## 2023-10-20 DIAGNOSIS — I10 Essential (primary) hypertension: Secondary | ICD-10-CM

## 2023-10-20 DIAGNOSIS — H43813 Vitreous degeneration, bilateral: Secondary | ICD-10-CM

## 2023-12-13 DIAGNOSIS — D2272 Melanocytic nevi of left lower limb, including hip: Secondary | ICD-10-CM | POA: Diagnosis not present

## 2023-12-13 DIAGNOSIS — D485 Neoplasm of uncertain behavior of skin: Secondary | ICD-10-CM | POA: Diagnosis not present

## 2023-12-13 DIAGNOSIS — Z1283 Encounter for screening for malignant neoplasm of skin: Secondary | ICD-10-CM | POA: Diagnosis not present

## 2023-12-13 DIAGNOSIS — L218 Other seborrheic dermatitis: Secondary | ICD-10-CM | POA: Diagnosis not present

## 2023-12-13 DIAGNOSIS — D225 Melanocytic nevi of trunk: Secondary | ICD-10-CM | POA: Diagnosis not present

## 2023-12-23 DIAGNOSIS — D485 Neoplasm of uncertain behavior of skin: Secondary | ICD-10-CM | POA: Diagnosis not present

## 2023-12-23 DIAGNOSIS — L820 Inflamed seborrheic keratosis: Secondary | ICD-10-CM | POA: Diagnosis not present

## 2023-12-23 DIAGNOSIS — L98499 Non-pressure chronic ulcer of skin of other sites with unspecified severity: Secondary | ICD-10-CM | POA: Diagnosis not present

## 2024-01-17 ENCOUNTER — Ambulatory Visit
Admission: EM | Admit: 2024-01-17 | Discharge: 2024-01-17 | Disposition: A | Discharge status: Home / Self Care | Attending: Family Medicine | Admitting: Family Medicine

## 2024-01-17 DIAGNOSIS — N390 Urinary tract infection, site not specified: Secondary | ICD-10-CM | POA: Insufficient documentation

## 2024-01-17 DIAGNOSIS — R030 Elevated blood-pressure reading, without diagnosis of hypertension: Secondary | ICD-10-CM | POA: Diagnosis not present

## 2024-01-17 LAB — POCT URINE DIPSTICK
Bilirubin, UA: NEGATIVE
Glucose, UA: NEGATIVE mg/dL
Ketones, POC UA: NEGATIVE mg/dL
Nitrite, UA: POSITIVE — AB
POC PROTEIN,UA: 100 — AB
Spec Grav, UA: 1.02 (ref 1.010–1.025)
Urobilinogen, UA: 1 U/dL
pH, UA: 6 (ref 5.0–8.0)

## 2024-01-17 MED ORDER — CEPHALEXIN 500 MG PO CAPS: 500.0000 mg | ORAL_CAPSULE | Freq: Two times a day (BID) | ORAL | 0 refills | Status: AC

## 2024-01-17 NOTE — Discharge Instructions (Signed)
 We will give you a call if your urine culture tells us  that we need to make any changes to your medication or plan.  Drink plenty of water , take the full course of antibiotics unless told otherwise and follow-up for worsening or unresolving symptoms

## 2024-01-17 NOTE — ED Triage Notes (Signed)
 Patient states dysuria x 3 days. Patient states nausea last night. Patient has not taken any medication to help with symptoms.

## 2024-01-17 NOTE — ED Provider Notes (Signed)
 [facility]    CSN: [id number] Arrival date & time: 01/17/24  0840      History   Chief Complaint Chief Complaint  Patient presents with   Dysuria    HPI [name] is a 73 y.o. female.   Patient presenting today with 3-day history of dysuria, nausea.  Denies fever, chills, vomiting, pelvic or abdominal pain, hematuria, flank pain.  So far trying Azo as needed with minimal relief.  History of urinary tract infections.    Past Medical History:  Diagnosis Date   Anxiety    Arthritis    Depression    Emotional stress 01/22/2016   patient fearful of postop problems stemming from earlier experience- crying and anxious during PST appt.   History of hysterectomy    Hx of cholecystectomy    Hypercholesterolemia    Hyperlipidemia    Hypertension    PONV (postoperative nausea and vomiting)    anesthesia vs Morphine    Pre-diabetes    Renal insufficiency    Shortness of breath dyspnea    from anxiety   Sleep apnea     Patient Active Problem List   Diagnosis Date Noted   Generalized anxiety disorder 06/20/2018   Insomnia 06/20/2018   Renal cell carcinoma of right kidney (HCC) 01/27/2016   Chest pain 12/31/2011    Past Surgical History:  Procedure Laterality Date   ABDOMINAL HYSTERECTOMY     CESAREAN SECTION     x2   CHOLECYSTECTOMY     ROBOTIC ASSITED PARTIAL NEPHRECTOMY Right 01/27/2016   Procedure: XI ROBOTIC ASSITED PARTIAL NEPHRECTOMY;  Surgeon: Gretel Ferrara, MD;  Location: [facility];  Service: Urology;  Laterality: Right;    OB History   No obstetric history on file.      Home Medications    Prior to Admission medications   Medication Sig Start Date End Date Taking? Authorizing Provider  ALPRAZolam  (XANAX ) 0.5 MG tablet TAKE 1 TABLET BY MOUTH EVERY DAY AS NEEDED FOR ANXIETY 04/19/23  Yes Franchot Harlene SQUIBB, PMHNP  cephALEXin  (KEFLEX ) 500 MG capsule Take 1 capsule (500 mg total) by mouth 2 (two) times daily. 01/17/24  Yes Stuart Vernell Norris, PA-C  cholecalciferol (VITAMIN D3) 25 MCG (1000 UNIT) tablet Take 1,000 Units by mouth daily.   Yes [provider]  hydrochlorothiazide (HYDRODIURIL) 25 MG tablet Take 25 mg by mouth daily. 06/03/21  Yes [provider]  losartan (COZAAR) 100 MG tablet Take 100 mg by mouth daily. 04/21/19  Yes [provider]  Omega-3 Fatty Acids (FISH OIL PO) Take by mouth.   Yes [provider]  pravastatin  (PRAVACHOL ) 20 MG tablet Take 20 mg by mouth every evening. 12/01/15  Yes [provider]  ramelteon  (ROZEREM ) 8 MG tablet Take 1 tablet (8 mg total) by mouth at bedtime as needed for sleep. 04/19/23  Yes Franchot Harlene SQUIBB, PMHNP  ramelteon  (ROZEREM ) 8 MG tablet Take 1 tablet (8 mg total) by mouth at bedtime. 04/19/23  Yes Franchot Harlene SQUIBB, PMHNP  sertraline  (ZOLOFT ) 50 MG tablet Take 1-2 tabs po qd 04/19/23  Yes Franchot Harlene SQUIBB, PMHNP  zolpidem  (AMBIEN ) 5 MG tablet Take 1/2-1 tablet at bedtime as needed for insomnia. 04/19/23  Yes Franchot Harlene SQUIBB, PMHNP  acetaminophen  (TYLENOL ) 500 MG tablet Take 1,000 mg by mouth every 6 (six) hours as needed for mild pain.    [provider]    Family History Family History  Problem Relation Age of Onset   Breast cancer  Neg Hx     Social History Social History   Tobacco Use   Smoking status: Former   Smokeless tobacco: Never  Substance Use Topics   Alcohol use: No   Drug use: No     Allergies   Ace inhibitors, Norvasc [amlodipine besylate], Nsaids, and Omnipaque [iohexol]   Review of Systems Review of Systems Per HPI  Physical Exam Triage Vital Signs ED Triage Vitals  Encounter Vitals Group     BP 01/17/24 0902 (!) 164/99     Girls Systolic BP Percentile --      Girls Diastolic BP Percentile --      Boys Systolic BP Percentile --      Boys Diastolic BP Percentile --      Pulse Rate 01/17/24 0902 89     Resp 01/17/24 0902 20     Temp 01/17/24 0902 98.1 F (36.7 C)     Temp  Source 01/17/24 0902 Oral     SpO2 01/17/24 0902 95 %     Weight --      Height --      Head Circumference --      Peak Flow --      Pain Score 01/17/24 0905 0     Pain Loc --      Pain Education --      Exclude from Growth Chart --    No data found.  Updated Vital Signs BP (!) 164/99 (BP Location: Left Arm)   Pulse 89   Temp 98.1 F (36.7 C) (Oral)   Resp 20   SpO2 95%   Visual Acuity Right Eye Distance:   Left Eye Distance:   Bilateral Distance:    Right Eye Near:   Left Eye Near:    Bilateral Near:     Physical Exam Vitals and nursing note reviewed.  Constitutional:      Appearance: Normal appearance. She is not ill-appearing.  HENT:     Head: Atraumatic.  Eyes:     Extraocular Movements: Extraocular movements intact.     Conjunctiva/sclera: Conjunctivae normal.  Cardiovascular:     Rate and Rhythm: Normal rate.  Pulmonary:     Effort: Pulmonary effort is normal.  Abdominal:     General: Bowel sounds are normal. There is no distension.     Palpations: Abdomen is soft.     Tenderness: There is no abdominal tenderness. There is no right CVA tenderness, left CVA tenderness or guarding.  Musculoskeletal:        General: Normal range of motion.     Cervical back: Normal range of motion and neck supple.  Skin:    General: Skin is warm and dry.  Neurological:     Mental Status: She is alert and oriented to person, place, and time.  Psychiatric:        Mood and Affect: Mood normal.        Thought Content: Thought content normal.        Judgment: Judgment normal.     UC Treatments / Results  Labs (all labs ordered are listed, but only abnormal results are displayed) Labs Reviewed  POCT URINE DIPSTICK - Abnormal; Notable for the following components:      Result Value   Clarity, UA cloudy (*)    Blood, UA large (*)    POC PROTEIN,UA =100 (*)    Nitrite, UA Positive (*)    Leukocytes, UA Large (3+) (*)    All other components within normal limits  URINE CULTURE    EKG   Radiology No results found.  Procedures Procedures (including critical care time)  Medications Ordered in UC Medications - No data to display  Initial Impression / Assessment and Plan / UC Course  I have reviewed the triage vital signs and the nursing notes.  Pertinent labs & imaging results that were available during my care of the patient were reviewed by me and considered in my medical decision making (see chart for details).     Hypertensive in triage, monitor home blood pressure readings and follow-up with PCP if not regulating back to normal range.  DASH diet, exercise.  Urinalysis today with evidence of urinary tract infection, urine culture pending, treat with Keflex , fluids, Azo as needed and follow-up for worsening or unresolving symptoms.  Final Clinical Impressions(s) / UC Diagnoses   Final diagnoses:  Acute lower UTI  Elevated blood pressure reading     Discharge Instructions      We will give you a call if your urine culture tells us  that we need to make any changes to your medication or plan.  Drink plenty of water , take the full course of antibiotics unless told otherwise and follow-up for worsening or unresolving symptoms    ED Prescriptions     Medication Sig Dispense Auth. Provider   cephALEXin  (KEFLEX ) 500 MG capsule Take 1 capsule (500 mg total) by mouth 2 (two) times daily. 10 capsule Stuart Vernell Norris, [place]      PDMP not reviewed this encounter.   Stuart Vernell Norris, [place] 01/17/24 1003

## 2024-01-19 LAB — URINE CULTURE: Culture: >=100000 — AB

## 2024-01-20 ENCOUNTER — Ambulatory Visit (HOSPITAL_COMMUNITY): Payer: Self-pay

## 2024-01-21 DIAGNOSIS — Z23 Encounter for immunization: Secondary | ICD-10-CM | POA: Diagnosis not present

## 2024-01-24 DIAGNOSIS — L820 Inflamed seborrheic keratosis: Secondary | ICD-10-CM | POA: Diagnosis not present

## 2024-01-24 DIAGNOSIS — D2261 Melanocytic nevi of right upper limb, including shoulder: Secondary | ICD-10-CM | POA: Diagnosis not present

## 2024-01-24 DIAGNOSIS — D2271 Melanocytic nevi of right lower limb, including hip: Secondary | ICD-10-CM | POA: Diagnosis not present

## 2024-03-15 DIAGNOSIS — Z1211 Encounter for screening for malignant neoplasm of colon: Secondary | ICD-10-CM | POA: Diagnosis not present

## 2024-03-26 LAB — COLOGUARD: COLOGUARD: NEGATIVE

## 2024-04-18 ENCOUNTER — Ambulatory Visit: Payer: Medicare Other | Admitting: Physician Assistant

## 2024-07-19 ENCOUNTER — Encounter (INDEPENDENT_AMBULATORY_CARE_PROVIDER_SITE_OTHER): Admitting: Ophthalmology

## 2024-08-30 ENCOUNTER — Encounter (INDEPENDENT_AMBULATORY_CARE_PROVIDER_SITE_OTHER): Admitting: Ophthalmology
